# Patient Record
Sex: Female | Born: 1965
Health system: Southern US, Community
[De-identification: ages and names within clinical notes are randomized; demographics above are authoritative.]

## PROBLEM LIST (undated history)

## (undated) DIAGNOSIS — Z9889 Other specified postprocedural states: Secondary | ICD-10-CM

## (undated) DIAGNOSIS — D259 Leiomyoma of uterus, unspecified: Secondary | ICD-10-CM

## (undated) DIAGNOSIS — R112 Nausea with vomiting, unspecified: Secondary | ICD-10-CM

## (undated) DIAGNOSIS — D649 Anemia, unspecified: Secondary | ICD-10-CM

## (undated) DIAGNOSIS — J45909 Unspecified asthma, uncomplicated: Secondary | ICD-10-CM

## (undated) DIAGNOSIS — R011 Cardiac murmur, unspecified: Secondary | ICD-10-CM

## (undated) DIAGNOSIS — R51 Headache: Secondary | ICD-10-CM

## (undated) DIAGNOSIS — O3410 Maternal care for benign tumor of corpus uteri, unspecified trimester: Secondary | ICD-10-CM

## (undated) DIAGNOSIS — F419 Anxiety disorder, unspecified: Secondary | ICD-10-CM

---

## 1998-03-24 ENCOUNTER — Inpatient Hospital Stay (HOSPITAL_COMMUNITY): Admission: AD | Admit: 1998-03-24 | Discharge: 1998-03-24 | Payer: Self-pay | Admitting: Obstetrics and Gynecology

## 1998-04-30 ENCOUNTER — Inpatient Hospital Stay (HOSPITAL_COMMUNITY): Admission: AD | Admit: 1998-04-30 | Discharge: 1998-05-05 | Payer: Self-pay | Admitting: Obstetrics and Gynecology

## 1998-06-13 ENCOUNTER — Other Ambulatory Visit: Admission: RE | Admit: 1998-06-13 | Discharge: 1998-06-13 | Payer: Self-pay | Admitting: Obstetrics and Gynecology

## 2007-12-03 HISTORY — PX: UTERINE FIBROID EMBOLIZATION: SHX825

## 2012-05-22 ENCOUNTER — Encounter (HOSPITAL_COMMUNITY): Payer: Self-pay | Admitting: Pharmacist

## 2012-05-25 ENCOUNTER — Encounter (HOSPITAL_COMMUNITY)
Admission: RE | Admit: 2012-05-25 | Discharge: 2012-05-25 | Disposition: A | Discharge status: Home / Self Care | Payer: BC Managed Care – PPO | Source: Ambulatory Visit | Attending: Obstetrics and Gynecology | Admitting: Obstetrics and Gynecology

## 2012-05-25 ENCOUNTER — Encounter (HOSPITAL_COMMUNITY): Payer: Self-pay

## 2012-05-25 HISTORY — DX: Cardiac murmur, unspecified: R01.1

## 2012-05-25 HISTORY — DX: Anxiety disorder, unspecified: F41.9

## 2012-05-25 HISTORY — DX: Anemia, unspecified: D64.9

## 2012-05-25 HISTORY — DX: Headache: R51

## 2012-05-25 HISTORY — DX: Unspecified asthma, uncomplicated: J45.909

## 2012-05-25 LAB — SURGICAL PCR SCREEN
MRSA, PCR: NEGATIVE
Staphylococcus aureus: NEGATIVE

## 2012-05-25 LAB — CBC
HCT: 39.9 % (ref 36.0–46.0)
Hemoglobin: 13.1 g/dL (ref 12.0–15.0)
MCHC: 32.8 g/dL (ref 30.0–36.0)
RDW: 13.6 % (ref 11.5–15.5)
WBC: 8.5 10*3/uL (ref 4.0–10.5)

## 2012-05-25 NOTE — Patient Instructions (Addendum)
YOUR PROCEDURE IS SCHEDULED ON:06/08/12  ENTER THROUGH THE MAIN ENTRANCE OF [facility] AT:6am  USE DESK PHONE AND DIAL [id number] TO INFORM us OF YOUR ARRIVAL  CALL [phone] IF YOU HAVE ANY QUESTIONS OR PROBLEMS PRIOR TO YOUR ARRIVAL.  REMEMBER: DO NOT EAT OR DRINK AFTER MIDNIGHT :Sunday  SPECIAL INSTRUCTIONS:bring inhaler to hospital  YOU MAY BRUSH YOUR TEETH THE MORNING OF SURGERY   TAKE THESE MEDICINES THE DAY OF SURGERY WITH SIP OF WATER:none   DO NOT WEAR JEWELRY, EYE MAKEUP, LIPSTICK OR DARK FINGERNAIL POLISH DO NOT WEAR LOTIONS  DO NOT SHAVE FOR 48 HOURS PRIOR TO SURGERY  YOU WILL NOT BE ALLOWED TO DRIVE YOURSELF HOME.

## 2012-05-26 NOTE — H&P (Addendum)
   Bonnie Chen presents today for preop evaluation for total abdominal hysterectomy.  She is continuing to have problems with pressure.  The uterus is to the umbilicus and she desires definitive surgical intervention.  She has no further desires to have a child and presents for hysterectomy.  Menstrual cycles are now as long as 10 days.  Her anemia has mostly resolved.  She is on a multivitamin and iron daily, but she does continue to have a lot of pelvic pressure and a 20-week size fibroid uterus.  The last ultrasound does show multiple calcified fibroids, the largest measuring 7 cm in size.  Smaller ones measuring 5.7 and 4.4 cm in size.  There is a right simple ovarian cyst measuring 3.4 cm in size.   O: Heart:  Regular rate and rhythm.  Lungs clear to auscultation bilaterally.  Abdomen is distended to the umbilicus, approximately 8-9 cm wide by palpation.  Pelvic exam:  Normal Bartholin's, Skene's, urethra.  A small amount of vaginal discharge is noted.  Uterus is anteverted, mobile with the fundus just below the umbilicus, approximately 8-9 cm in width.  Past medical history is significant for exercise-induced asthma.  Past surgical history:  She has had three Cesarean sections and uterine embolization.  She has allergies to cats, dogs and almonds.  Currently on no other medicines.   A&P: Large abdominal fibroids, 20-week size, abnormal bleeding, anemia and pelvic pressure.  No further childbearing desires.  Plan total abdominal hysterectomy with preservation of the ovaries.  Discussed the risks and benefits of the procedure at length including but not limited to risk of infection, bleeding, damage to bowel, bladder, ovaries.  We will try to preserve both ovaries if we can.  She would really like to try to get through her Pfannenstiel skin incision if we can try to do this.  Discussed the limitations but we will try to do this.  If not, we may have to extend it to a midline incision.  Discussed the procedure,  risks and benefits, risk of blood transfusion, risks with general anesthesia.  She does give her informed consent and wishes to proceed.  Discussed recovery at length.  Reviewed the previous MRI results, the previous ultrasound reports, previous visits and discussed all of this with the patient. Dineen Kid Rana Snare, MD/rad  This patient has been seen and examined.   All of her questions were answered.  Labs and vital signs reviewed.  Informed consent has been obtained.  The History and Physical is current.  06/08/12 DL

## 2012-06-07 MED ORDER — DEXTROSE 5 % IV SOLN
2.0000 g | INTRAVENOUS | Status: AC
  Administered 2012-06-08: 2 g via INTRAVENOUS
  Filled 2012-06-07: qty 2

## 2012-06-08 ENCOUNTER — Encounter (HOSPITAL_COMMUNITY): Payer: Self-pay | Admitting: Anesthesiology

## 2012-06-08 ENCOUNTER — Inpatient Hospital Stay (HOSPITAL_COMMUNITY)
Admission: AD | Admit: 2012-06-08 | Discharge: 2012-06-10 | DRG: 359 | Disposition: A | Discharge status: Home / Self Care | Payer: BC Managed Care – PPO | Source: Ambulatory Visit | Attending: Obstetrics and Gynecology | Admitting: Obstetrics and Gynecology

## 2012-06-08 ENCOUNTER — Inpatient Hospital Stay (HOSPITAL_COMMUNITY): Payer: BC Managed Care – PPO | Admitting: Anesthesiology

## 2012-06-08 ENCOUNTER — Encounter (HOSPITAL_COMMUNITY): Payer: Self-pay | Admitting: *Deleted

## 2012-06-08 ENCOUNTER — Encounter (HOSPITAL_COMMUNITY)
Admission: AD | Disposition: A | Discharge status: Home / Self Care | Payer: Self-pay | Source: Ambulatory Visit | Attending: Obstetrics and Gynecology

## 2012-06-08 DIAGNOSIS — D252 Subserosal leiomyoma of uterus: Secondary | ICD-10-CM | POA: Diagnosis present

## 2012-06-08 DIAGNOSIS — Z01812 Encounter for preprocedural laboratory examination: Secondary | ICD-10-CM

## 2012-06-08 DIAGNOSIS — N949 Unspecified condition associated with female genital organs and menstrual cycle: Secondary | ICD-10-CM | POA: Diagnosis present

## 2012-06-08 DIAGNOSIS — D251 Intramural leiomyoma of uterus: Principal | ICD-10-CM | POA: Diagnosis present

## 2012-06-08 DIAGNOSIS — N938 Other specified abnormal uterine and vaginal bleeding: Secondary | ICD-10-CM | POA: Diagnosis present

## 2012-06-08 DIAGNOSIS — D649 Anemia, unspecified: Secondary | ICD-10-CM | POA: Diagnosis present

## 2012-06-08 DIAGNOSIS — Z01818 Encounter for other preprocedural examination: Secondary | ICD-10-CM

## 2012-06-08 DIAGNOSIS — Z90710 Acquired absence of both cervix and uterus: Secondary | ICD-10-CM

## 2012-06-08 HISTORY — PX: ABDOMINAL HYSTERECTOMY: SHX81

## 2012-06-08 SURGERY — HYSTERECTOMY, ABDOMINAL
Anesthesia: General | Site: Abdomen | Wound class: Clean Contaminated

## 2012-06-08 MED ORDER — HYDROMORPHONE HCL PF 1 MG/ML IJ SOLN
INTRAMUSCULAR | Status: AC
  Filled 2012-06-08: qty 1

## 2012-06-08 MED ORDER — OXYCODONE-ACETAMINOPHEN 5-325 MG PO TABS
1.0000 | ORAL_TABLET | ORAL | Status: DC | PRN
  Administered 2012-06-09 (×2): 2 via ORAL
  Administered 2012-06-10: 1 via ORAL
  Filled 2012-06-08: qty 2
  Filled 2012-06-08: qty 1
  Filled 2012-06-08: qty 2

## 2012-06-08 MED ORDER — ACETAMINOPHEN 325 MG PO TABS: 325.0000 mg | ORAL_TABLET | ORAL | Status: DC | PRN

## 2012-06-08 MED ORDER — KETOROLAC TROMETHAMINE 30 MG/ML IJ SOLN: 15.0000 mg | Freq: Once | INTRAMUSCULAR | Status: DC | PRN

## 2012-06-08 MED ORDER — MIDAZOLAM HCL 5 MG/5ML IJ SOLN
INTRAMUSCULAR | Status: DC | PRN
  Administered 2012-06-08: 2 mg via INTRAVENOUS

## 2012-06-08 MED ORDER — ROCURONIUM BROMIDE 50 MG/5ML IV SOLN
INTRAVENOUS | Status: AC
  Filled 2012-06-08: qty 1

## 2012-06-08 MED ORDER — NEOSTIGMINE METHYLSULFATE 1 MG/ML IJ SOLN
INTRAMUSCULAR | Status: AC
  Filled 2012-06-08: qty 10

## 2012-06-08 MED ORDER — GLYCOPYRROLATE 0.2 MG/ML IJ SOLN
INTRAMUSCULAR | Status: DC | PRN
  Administered 2012-06-08: .2 mg via INTRAVENOUS
  Administered 2012-06-08: 0.4 mg via INTRAVENOUS

## 2012-06-08 MED ORDER — DIPHENHYDRAMINE HCL 50 MG/ML IJ SOLN: 12.5000 mg | Freq: Four times a day (QID) | INTRAMUSCULAR | Status: DC | PRN

## 2012-06-08 MED ORDER — LACTATED RINGERS IV SOLN
INTRAVENOUS | Status: DC | PRN
  Administered 2012-06-08 (×3): via INTRAVENOUS

## 2012-06-08 MED ORDER — MIDAZOLAM HCL 2 MG/2ML IJ SOLN
INTRAMUSCULAR | Status: AC
  Filled 2012-06-08: qty 2

## 2012-06-08 MED ORDER — NEOSTIGMINE METHYLSULFATE 1 MG/ML IJ SOLN
INTRAMUSCULAR | Status: DC | PRN
  Administered 2012-06-08: 3 mg via INTRAVENOUS

## 2012-06-08 MED ORDER — MORPHINE SULFATE (PF) 1 MG/ML IV SOLN: INTRAVENOUS | Status: DC

## 2012-06-08 MED ORDER — LIDOCAINE HCL (CARDIAC) 20 MG/ML IV SOLN
INTRAVENOUS | Status: AC
  Filled 2012-06-08: qty 5

## 2012-06-08 MED ORDER — FENTANYL CITRATE 0.05 MG/ML IJ SOLN
INTRAMUSCULAR | Status: AC
  Filled 2012-06-08: qty 5

## 2012-06-08 MED ORDER — ONDANSETRON HCL 4 MG/2ML IJ SOLN: 4.0000 mg | Freq: Four times a day (QID) | INTRAMUSCULAR | Status: DC | PRN

## 2012-06-08 MED ORDER — LORAZEPAM 2 MG/ML IJ SOLN: 0.5000 mg | Freq: Four times a day (QID) | INTRAMUSCULAR | Status: DC | PRN

## 2012-06-08 MED ORDER — DIPHENHYDRAMINE HCL 12.5 MG/5ML PO ELIX: 12.5000 mg | ORAL_SOLUTION | Freq: Four times a day (QID) | ORAL | Status: DC | PRN

## 2012-06-08 MED ORDER — NALOXONE HCL 0.4 MG/ML IJ SOLN: 0.4000 mg | INTRAMUSCULAR | Status: DC | PRN

## 2012-06-08 MED ORDER — PROPOFOL 10 MG/ML IV EMUL
INTRAVENOUS | Status: DC | PRN
  Administered 2012-06-08: 200 mg via INTRAVENOUS

## 2012-06-08 MED ORDER — GLYCOPYRROLATE 0.2 MG/ML IJ SOLN
INTRAMUSCULAR | Status: AC
  Filled 2012-06-08: qty 1

## 2012-06-08 MED ORDER — DEXAMETHASONE SODIUM PHOSPHATE 4 MG/ML IJ SOLN
INTRAMUSCULAR | Status: DC | PRN
  Administered 2012-06-08: 10 mg via INTRAVENOUS

## 2012-06-08 MED ORDER — SODIUM CHLORIDE 0.9 % IJ SOLN: 9.0000 mL | INTRAMUSCULAR | Status: DC | PRN

## 2012-06-08 MED ORDER — HYDROMORPHONE HCL PF 1 MG/ML IJ SOLN
INTRAMUSCULAR | Status: DC | PRN
  Administered 2012-06-08 (×2): 1 mg via INTRAVENOUS

## 2012-06-08 MED ORDER — PROMETHAZINE HCL 25 MG/ML IJ SOLN: 6.2500 mg | INTRAMUSCULAR | Status: DC | PRN

## 2012-06-08 MED ORDER — ONDANSETRON HCL 4 MG/2ML IJ SOLN
INTRAMUSCULAR | Status: DC | PRN
  Administered 2012-06-08: 4 mg via INTRAVENOUS

## 2012-06-08 MED ORDER — MORPHINE SULFATE 0.5 MG/ML IJ SOLN
INTRAMUSCULAR | Status: AC
  Filled 2012-06-08: qty 10

## 2012-06-08 MED ORDER — ZOLPIDEM TARTRATE 5 MG PO TABS: 5.0000 mg | ORAL_TABLET | Freq: Every evening | ORAL | Status: DC | PRN

## 2012-06-08 MED ORDER — MIDAZOLAM HCL 2 MG/2ML IJ SOLN: 0.5000 mg | Freq: Once | INTRAMUSCULAR | Status: DC | PRN

## 2012-06-08 MED ORDER — FENTANYL CITRATE 0.05 MG/ML IJ SOLN
INTRAMUSCULAR | Status: AC
  Administered 2012-06-08: 50 ug via INTRAVENOUS
  Filled 2012-06-08: qty 2

## 2012-06-08 MED ORDER — MEPERIDINE HCL 25 MG/ML IJ SOLN: 6.2500 mg | INTRAMUSCULAR | Status: DC | PRN

## 2012-06-08 MED ORDER — FENTANYL CITRATE 0.05 MG/ML IJ SOLN
25.0000 ug | INTRAMUSCULAR | Status: DC | PRN
  Administered 2012-06-08 (×2): 50 ug via INTRAVENOUS

## 2012-06-08 MED ORDER — LIDOCAINE HCL (CARDIAC) 20 MG/ML IV SOLN
INTRAVENOUS | Status: DC | PRN
  Administered 2012-06-08: 100 mg via INTRAVENOUS

## 2012-06-08 MED ORDER — HYDROMORPHONE 0.3 MG/ML IV SOLN
INTRAVENOUS | Status: DC
  Administered 2012-06-08: 0.399 mg via INTRAVENOUS
  Administered 2012-06-08: 3.6 mg via INTRAVENOUS
  Administered 2012-06-08: 0.4 mg via INTRAVENOUS
  Administered 2012-06-08: 11:00:00 via INTRAVENOUS
  Administered 2012-06-09: 0.4 mg via INTRAVENOUS
  Administered 2012-06-09: 0.799 mg via INTRAVENOUS
  Administered 2012-06-09: 0.2 mg via INTRAVENOUS
  Filled 2012-06-08: qty 25

## 2012-06-08 MED ORDER — ROCURONIUM BROMIDE 100 MG/10ML IV SOLN
INTRAVENOUS | Status: DC | PRN
  Administered 2012-06-08: 40 mg via INTRAVENOUS

## 2012-06-08 MED ORDER — FENTANYL CITRATE 0.05 MG/ML IJ SOLN
INTRAMUSCULAR | Status: DC | PRN
  Administered 2012-06-08: 100 ug via INTRAVENOUS
  Administered 2012-06-08: 150 ug via INTRAVENOUS

## 2012-06-08 MED ORDER — IBUPROFEN 600 MG PO TABS
600.0000 mg | ORAL_TABLET | Freq: Four times a day (QID) | ORAL | Status: DC | PRN
  Administered 2012-06-09 – 2012-06-10 (×4): 600 mg via ORAL
  Filled 2012-06-08 (×4): qty 1

## 2012-06-08 MED ORDER — HYDROMORPHONE HCL PF 1 MG/ML IJ SOLN: 0.2000 mg | INTRAMUSCULAR | Status: DC | PRN

## 2012-06-08 MED ORDER — ALBUTEROL SULFATE HFA 108 (90 BASE) MCG/ACT IN AERS
2.0000 | INHALATION_SPRAY | Freq: Four times a day (QID) | RESPIRATORY_TRACT | Status: DC | PRN
  Filled 2012-06-08: qty 6.7

## 2012-06-08 MED ORDER — MENTHOL 3 MG MT LOZG: 1.0000 | LOZENGE | OROMUCOSAL | Status: DC | PRN

## 2012-06-08 MED ORDER — PROPOFOL 10 MG/ML IV EMUL
INTRAVENOUS | Status: AC
  Filled 2012-06-08: qty 20

## 2012-06-08 MED ORDER — DEXTROSE-NACL 5-0.45 % IV SOLN
INTRAVENOUS | Status: DC
  Administered 2012-06-08 – 2012-06-09 (×3): via INTRAVENOUS

## 2012-06-08 MED ORDER — ONDANSETRON HCL 4 MG/2ML IJ SOLN
INTRAMUSCULAR | Status: AC
  Filled 2012-06-08: qty 2

## 2012-06-08 SURGICAL SUPPLY — 26 items
CANISTER SUCTION 2500CC (MISCELLANEOUS) ×2 IMPLANT
CLOTH BEACON ORANGE TIMEOUT ST (SAFETY) ×2 IMPLANT
DRSG COVADERM 4X10 (GAUZE/BANDAGES/DRESSINGS) ×2 IMPLANT
ELECT LIGASURE LONG (ELECTRODE) ×2 IMPLANT
GLOVE BIO SURGEON STRL SZ7.5 (GLOVE) ×2 IMPLANT
GLOVE BIO SURGEON STRL SZ8 (GLOVE) ×2 IMPLANT
GLOVE SURG ORTHO 8.0 STRL STRW (GLOVE) ×2 IMPLANT
GOWN PREVENTION PLUS LG XLONG (DISPOSABLE) ×4 IMPLANT
GOWN STRL REIN XL XLG (GOWN DISPOSABLE) ×2 IMPLANT
NS IRRIG 1000ML POUR BTL (IV SOLUTION) ×4 IMPLANT
PACK ABDOMINAL GYN (CUSTOM PROCEDURE TRAY) ×2 IMPLANT
PAD OB MATERNITY 4.3X12.25 (PERSONAL CARE ITEMS) ×2 IMPLANT
PROTECTOR NERVE ULNAR (MISCELLANEOUS) ×2 IMPLANT
SPONGE LAP 18X18 X RAY DECT (DISPOSABLE) ×4 IMPLANT
STAPLER VISISTAT 35W (STAPLE) ×2 IMPLANT
SUT CHROMIC 3 0 SH 27 (SUTURE) ×2 IMPLANT
SUT MNCRL 0 MO-4 VIOLET 18 CR (SUTURE) ×3 IMPLANT
SUT MNCRL 0 VIOLET 6X18 (SUTURE) ×1 IMPLANT
SUT MONOCRYL 0 6X18 (SUTURE) ×1
SUT MONOCRYL 0 MO 4 18  CR/8 (SUTURE) ×3
SUT PDS AB 0 CT1 27 (SUTURE) ×4 IMPLANT
SUT VIC AB 1 CTX 36 (SUTURE) ×2
SUT VIC AB 1 CTX36XBRD ANBCTRL (SUTURE) ×2 IMPLANT
SUT VIC AB 2-0 CT1 (SUTURE) ×2 IMPLANT
TOWEL OR 17X24 6PK STRL BLUE (TOWEL DISPOSABLE) ×4 IMPLANT
TRAY FOLEY CATH 14FR (SET/KITS/TRAYS/PACK) ×2 IMPLANT

## 2012-06-08 NOTE — Progress Notes (Signed)
Patient ID: Bonnie Chen, female   DOB: 08/13/1966, 47 y.o.   MRN: [id number] Pt without complaints Pain well controlled VSSAF UOP good Bandage dry Abd soft nt  POST OP Doing well  Discussed plan of care and discussed surgical findings DL

## 2012-06-08 NOTE — Transfer of Care (Signed)
Immediate Anesthesia Transfer of Care Note  Patient: Bonnie Chen  Procedure(s) Performed: Procedure(s) (LRB): HYSTERECTOMY ABDOMINAL (N/A)  Patient Location: PACU  Anesthesia Type: General  Level of Consciousness: awake, alert  and oriented  Airway & Oxygen Therapy: Patient Spontanous Breathing and Patient connected to nasal cannula oxygen  Post-op Assessment: Report given to PACU RN and Post -op Vital signs reviewed and stable  Post vital signs: stable  Complications: No apparent anesthesia complications

## 2012-06-08 NOTE — Anesthesia Postprocedure Evaluation (Signed)
  Anesthesia Post-op Note  Patient: Bonnie Chen  Procedure(s) Performed: Procedure(s) (LRB): HYSTERECTOMY ABDOMINAL (N/A) Patient is awake and responsive. Pain and nausea are reasonably well controlled. Vital signs are stable and clinically acceptable. Oxygen saturation is clinically acceptable. There are no apparent anesthetic complications at this time. Patient is ready for discharge.

## 2012-06-08 NOTE — Addendum Note (Signed)
Addendum  created 06/08/12 1756 by Suella Grove, CRNA   Modules edited:Notes Section

## 2012-06-08 NOTE — Op Note (Signed)
NAME:  Bonnie Chen, [name]:  [id number]  MEDICAL RECORD NO.:  [id number]  LOCATION:  9302                          FACILITY:  [facility]  PHYSICIAN:  Dineen Kid. Rana Snare, M.D.    DATE OF BIRTH:  1966-05-21  DATE OF PROCEDURE:  06/08/2012 DATE OF DISCHARGE:                              OPERATIVE REPORT   PREOPERATIVE DIAGNOSIS:  Large abdominal fibroid, 20-week size. Abnormal uterine bleeding, anemia, pelvic pressure.  No further childbearing desires.  POSTOPERATIVE DIAGNOSIS:  Large abdominal fibroid, 20-week size. Abnormal uterine bleeding, anemia, pelvic pressure. No further childbearing desires.  PROCEDURE:  Total abdominal hysterectomy.  SURGEON:  Dineen Kid. Rana Snare, MD  ASSISTANT:  Freddy Finner, MD  ANESTHESIA:  General endotracheal.  INDICATIONS:  [name] is a 46 year old, with no further childbearing desires, having worsening problems with pain, bleeding, fibroids, desires definitive surgical intervention.  Presents for hysterectomy. We discussed the risks and benefits of the procedure at length. Informed consent was obtained.  See history and physical for further details.  FINDINGS:  At the time of surgery, large uterine fibroids and pelvic adhesions, normal-appearing ovaries.  DESCRIPTION OF PROCEDURE:  After adequate analgesia, the patient was placed in the supine position.  She was sterilely prepped and draped. Bladder sterilely drained with Foley catheter.  Pfannenstiel skin incision made 2 fingerbreadths above the pubic symphysis, taken down sharply.  Fascia was incised transversely, extended superiorly and inferiorly off the bellies rectus muscle, separated sharply in midline, peritoneum was entered sharply.  Large uterus was delivered through the incision with a thyroid tenaculum on the fundus of the uterus.  LigaSure instrument was used to ligate the round ligaments bilaterally and the utero-ovarian ligaments bilaterally.  Then the tubes and  ovaries followed lateral.  O'Connor-O'Sullivan retractor was then placed.  Bowel was packed cephalad.  The inferior portion of broad ligament was ligated with the LigaSure instrument down to the level of the uterine vasculature, which was ligated with LigaSure instrument.  The bladder was carefully dissected off the anterior surface of the cervix.  A LigaSure instrument was used to ligate across uterine vasculature down across the cardinal ligaments.  Haney clamp was then placed across the uterosacral ligaments bilaterally.  It was dissected and suture ligated with 0 Monocryl suture.  The vagina was entered sharply and the uterus and cervix were removed intact.  The vagina was identified and ligated with a figure-of-eight 0 Monocryl suture, incorporating the uterosacral ligaments.  The vagina was then closed in a horizontal fashion using figure-of-eights of 0 Monocryl suture with good approximation, good hemostasis achieved.  The uterosacral ligaments were then plicated in the midline.  Irrigation applied after adequate hemostasis was assured. Reexamination revealed good hemostasis of the ovarian pedicles.  The packing was then removed.  O'Connor-O'Sullivan retractor was then removed.  The peritoneum was then closed with 2-0 Vicryl suture.  The fascia was then closed with 2 sutures of 0 PDS in a running fashion. Irrigation was applied and after adequate hemostasis, the skin was stapled, Steri-Strips applied.  The patient tolerated procedure well was stable transfer to recovery room.  Sponge and instrument count was normal x3.  Estimated blood loss was 300 mL.  The patient received 2 g of cefotetan preoperatively.     Dineen Kid Rana Snare, M.D.     DCL/MEDQ  D:  06/08/2012  T:  06/08/2012  Job:  [id number]

## 2012-06-08 NOTE — Progress Notes (Signed)
Ur chart review completed.  

## 2012-06-08 NOTE — Anesthesia Preprocedure Evaluation (Addendum)
Anesthesia Evaluation  Patient identified by MRN, date of birth, ID band Patient awake    Reviewed: Allergy & Precautions, H&P , Patient's Chart, lab work & pertinent test results, reviewed documented beta blocker date and time   History of Anesthesia Complications Negative for: history of anesthetic complications  Airway Mallampati: II TM Distance: >3 FB Neck ROM: full    Dental No notable dental hx.    Pulmonary neg pulmonary ROS, asthma , Current Smoker,   Very mild wheeze bilateral with strong exhalation  + wheezing      Cardiovascular Exercise Tolerance: Good negative cardio ROS  + Valvular Problems/Murmurs Rhythm:regular Rate:Normal     Neuro/Psych  Headaches, PSYCHIATRIC DISORDERS Anxiety negative neurological ROS  negative psych ROS   GI/Hepatic negative GI ROS, Neg liver ROS,   Endo/Other  negative endocrine ROS  Renal/GU negative Renal ROS     Musculoskeletal   Abdominal   Peds  Hematology negative hematology ROS (+)   Anesthesia Other Findings Asthma     Anemia        Heart murmur     Headache        Anxiety   panic disorder    Reproductive/Obstetrics negative OB ROS                          Anesthesia Physical Anesthesia Plan  ASA: II  Anesthesia Plan: General ETT   Post-op Pain Management:    Induction:   Airway Management Planned:   Additional Equipment:   Intra-op Plan:   Post-operative Plan:   Informed Consent: I have reviewed the patients History and Physical, chart, labs and discussed the procedure including the risks, benefits and alternatives for the proposed anesthesia with the patient or authorized representative who has indicated his/her understanding and acceptance.   Dental Advisory Given  Plan Discussed with: CRNA and Surgeon  Anesthesia Plan Comments:         Anesthesia Quick Evaluation

## 2012-06-08 NOTE — Brief Op Note (Addendum)
06/08/2012  9:03 AM  PATIENT:  Bonnie Chen  46 y.o. female  PRE-OPERATIVE DIAGNOSIS:  Large 20 Week Size Fibroids, Abnormal Uterine Bleeding, Anemia, and Pelvic Pain  POST-OPERATIVE DIAGNOSIS:  Large 20 Week Size Fibroids, Abnormal Uterine Bleeding, Anemia, and Pelvic Pain  PROCEDURE:  Procedure(s) (LRB): HYSTERECTOMY ABDOMINAL (N/A)  SURGEON:  Surgeon(s) and Role:    * Turner Daniels, MD - Primary    * W Varney Baas, MD - Assisting  PHYSICIAN ASSISTANT:   ASSISTANTS:    ANESTHESIA:   general  EBL:  Total I/O In: 2000 [I.V.:2000] Out: 600 [Urine:300; Blood:300]  BLOOD ADMINISTERED:none  DRAINS: Urinary Catheter (Foley)   LOCAL MEDICATIONS USED:  NONE  SPECIMEN:  Source of Specimen:  uterus  DISPOSITION OF SPECIMEN:  PATHOLOGY  COUNTS:  YES  TOURNIQUET:  * No tourniquets in log *  DICTATION: .Other Dictation: Dictation Number [id number]  PLAN OF CARE: Admit to inpatient   PATIENT DISPOSITION:  PACU - hemodynamically stable.   Delay start of Pharmacological VTE agent (>24hrs) due to surgical blood loss or risk of bleeding: no

## 2012-06-08 NOTE — Progress Notes (Signed)
Pt's HR consistently 48-51bpm.  No SOB, chest pain or tightness, or dizziness.  Other VS stable.  Phoned Dr. Rana Snare with update.  He stated to reduce PCA to low-dose.  No other new orders received at this time.

## 2012-06-08 NOTE — Anesthesia Postprocedure Evaluation (Signed)
  Anesthesia Post-op Note  Patient: Bonnie Chen  Procedure(s) Performed: Procedure(s) (LRB): HYSTERECTOMY ABDOMINAL (N/A)  Patient Location: [facility]  Anesthesia Type: General  Level of Consciousness: awake, oriented, sedated and patient cooperative  Airway and Oxygen Therapy: Patient Spontanous Breathing and Patient connected to nasal cannula oxygen  Post-op Pain: mild  Post-op Assessment: Patient's Cardiovascular Status Stable and Respiratory Function Stable  Post-op Vital Signs: Reviewed and stable  Complications: No apparent anesthesia complications

## 2012-06-09 ENCOUNTER — Encounter (HOSPITAL_COMMUNITY): Payer: Self-pay | Admitting: Obstetrics and Gynecology

## 2012-06-09 LAB — CBC
Hemoglobin: 11.4 g/dL — ABNORMAL LOW (ref 12.0–15.0)
Platelets: 283 10*3/uL (ref 150–400)
RBC: 4.13 MIL/uL (ref 3.87–5.11)
WBC: 16 10*3/uL — ABNORMAL HIGH (ref 4.0–10.5)

## 2012-06-09 NOTE — Progress Notes (Signed)
1 Day Post-Op Procedure(s) (LRB): HYSTERECTOMY ABDOMINAL (N/A)  Subjective: Patient reports tolerating PO, + flatus and no problems voiding.    Objective: I have reviewed patient's vital signs, intake and output, medications and labs.  General: alert, cooperative and mild distress GI: soft, non-tender; bowel sounds normal; no masses,  no organomegaly  Assessment: s/p Procedure(s) (LRB): HYSTERECTOMY ABDOMINAL (N/A): stable and progressing well  Plan: Advance diet Encourage ambulation Advance to PO medication  LOS: 1 day    Kalee Broxton C 06/09/2012, 9:09 AM

## 2012-06-10 MED ORDER — IBUPROFEN 600 MG PO TABS: 600.0000 mg | ORAL_TABLET | Freq: Four times a day (QID) | ORAL | Status: AC | PRN

## 2012-06-10 MED ORDER — OXYCODONE-ACETAMINOPHEN 5-325 MG PO TABS: 1.0000 | ORAL_TABLET | ORAL | Status: AC | PRN

## 2012-06-10 NOTE — Discharge Summary (Signed)
Physician Discharge Summary  Patient ID: Bonnie Chen MRN: [id number] DOB/AGE: 07-Jun-1966 46 y.o.  Admit date: 06/08/2012 Discharge date: 06/10/2012  Admission Diagnoses:  Discharge Diagnoses:  Active Problems:  * No active hospital problems. *    Discharged Condition: good  Hospital Course: Pt underwent an uncomplicated TAH.  Her postop course was unremarkable with good return of bowel function and ambulation.  She had a stable postop Hgb and by post op day number 2 was tolerating po well, ambulating without difficulty and desired dc home.  Consults: None  Significant Diagnostic Studies:   Treatments: surgery: TAH  Discharge Exam: Blood pressure 108/74, pulse 53, temperature 98.2 F (36.8 C), temperature source Oral, resp. rate 16, height 5\' 3"  (1.6 m), weight 77.111 kg (170 lb), SpO2 97.00%. General appearance: alert, cooperative and no distress GI: soft, non-tender; bowel sounds normal; no masses,  no organomegaly Incision/Wound:CD & I  Disposition:   Discharge Orders    Future Orders Please Complete By Expires   Diet general      Increase activity slowly      Call MD for:  temperature >100.4      Call MD for:  persistant nausea and vomiting      Call MD for:  severe uncontrolled pain      Call MD for:  redness, tenderness, or signs of infection (pain, swelling, redness, odor or green/yellow discharge around incision site)      Call MD for:  difficulty breathing, headache or visual disturbances      Discharge instructions      Comments:   Call office for appt Friday to have staples removed   No dressing needed      Driving Restrictions      Comments:   No driving for 2 weeks   Sexual Activity Restrictions      Comments:   Nothing in vagina for 6 weeks     Medication List  As of 06/10/2012  9:40 AM   TAKE these medications         albuterol 108 (90 BASE) MCG/ACT inhaler   Commonly known as: PROVENTIL HFA;VENTOLIN HFA   Inhale 2 puffs into the lungs every 6  (six) hours as needed. For asthma      ferrous sulfate 325 (65 FE) MG tablet   Take 325 mg by mouth daily with breakfast.      ibuprofen 600 MG tablet   Commonly known as: ADVIL,MOTRIN   Take 1 tablet (600 mg total) by mouth every 6 (six) hours as needed (mild pain).      METROGEL EX   Apply 1 application topically daily as needed. For infection      multivitamin with minerals tablet   Take 1 tablet by mouth daily.      oxyCODONE-acetaminophen 5-325 MG per tablet   Commonly known as: PERCOCET   Take 1-2 tablets by mouth every 3 (three) hours as needed (moderate to severe pain (when tolerating fluids)).             Signed: Damoni Erker C 06/10/2012, 9:40 AM

## 2012-06-10 NOTE — Progress Notes (Signed)
Pt discharged   Teaching complete  Pt will return  To office  For staple removal

## 2012-06-10 NOTE — Progress Notes (Signed)
2 Days Post-Op Procedure(s) (LRB): HYSTERECTOMY ABDOMINAL (N/A)  Subjective: Patient reports tolerating PO, + flatus and no problems voiding.    Objective: I have reviewed patient's vital signs, intake and output, medications and microbiology.  General: alert, cooperative and mild distress GI: soft, non-tender; bowel sounds normal; no masses,  no organomegaly  Assessment: s/p Procedure(s) (LRB): HYSTERECTOMY ABDOMINAL (N/A): stable, progressing well and tolerating diet  Plan: Discharge home  LOS: 2 days    Eleasha Cataldo C 06/10/2012, 9:36 AM

## 2012-06-12 ENCOUNTER — Inpatient Hospital Stay (HOSPITAL_COMMUNITY)
Admission: AD | Admit: 2012-06-12 | Discharge: 2012-06-12 | Disposition: A | Discharge status: Home / Self Care | Payer: BC Managed Care – PPO | Source: Ambulatory Visit | Attending: Obstetrics and Gynecology | Admitting: Obstetrics and Gynecology

## 2012-06-12 ENCOUNTER — Encounter (HOSPITAL_COMMUNITY): Payer: Self-pay | Admitting: *Deleted

## 2012-06-12 DIAGNOSIS — T792XXA Traumatic secondary and recurrent hemorrhage and seroma, initial encounter: Secondary | ICD-10-CM

## 2012-06-12 DIAGNOSIS — IMO0002 Reserved for concepts with insufficient information to code with codable children: Secondary | ICD-10-CM | POA: Insufficient documentation

## 2012-06-12 DIAGNOSIS — T148XXA Other injury of unspecified body region, initial encounter: Secondary | ICD-10-CM

## 2012-06-12 HISTORY — DX: Maternal care for benign tumor of corpus uteri, unspecified trimester: O34.10

## 2012-06-12 HISTORY — DX: Other specified postprocedural states: Z98.890

## 2012-06-12 HISTORY — DX: Other specified postprocedural states: R11.2

## 2012-06-12 HISTORY — DX: Leiomyoma of uterus, unspecified: D25.9

## 2012-06-12 LAB — URINALYSIS, ROUTINE W REFLEX MICROSCOPIC
Glucose, UA: NEGATIVE mg/dL
Leukocytes, UA: NEGATIVE
Nitrite: NEGATIVE
Specific Gravity, Urine: 1.01 (ref 1.005–1.030)
pH: 6 (ref 5.0–8.0)

## 2012-06-12 LAB — URINE MICROSCOPIC-ADD ON

## 2012-06-12 NOTE — MAU Note (Signed)
Pt also c/o urinary frequency and urgency since foley cath was removed after surgery.

## 2012-06-12 NOTE — MAU Note (Signed)
Pt states had total hysterectomy 06/08/2012, large fibroid removed as well. Pt has lower abdominal incision that is closed with staples at present. Has had issues with bleeding post-op while still in hospital. Pt had large gush of blood this afternoon while awaiting MD advice.

## 2012-06-12 NOTE — MAU Provider Note (Signed)
History     CSN: [id number]  Arrival date and time: 06/12/12 1313   First Provider Initiated Contact with Patient 06/12/12 1420      Chief Complaint  Patient presents with  . Post-op incision splitting    HPI  46 yo [id number] female who presents with complaints of post-operative incision drainage s/p abdominal hysterectomy 06/08/12. Pt states would has been draining bloody fluid X2 days following coughing spell.  Pt believes she was supposed to have staples taken out today at office called after office hours. Pt has been using gauze pads to absorb drainage; pads have not been saturated.  OB History    Grav Para Term Preterm Abortions TAB SAB Ect Mult Living   7 3 3  4 3 1   3       Past Medical History  Diagnosis Date  . Asthma   . Anemia   . Heart murmur   . Headache   . Anxiety     panic disorder  . Uterine fibroids affecting pregnancy   . PONV (postoperative nausea and vomiting)     Past Surgical History  Procedure Date  . Cesarean section     x 3  . Uterine fibroid embolization 2009  . Abdominal hysterectomy 06/08/2012    Procedure: HYSTERECTOMY ABDOMINAL;  Surgeon: Turner Daniels, MD;  Location: [facility];  Service: Gynecology;  Laterality: N/A;    Family History  Problem Relation Age of Onset  . Other Neg Hx     History  Substance Use Topics  . Smoking status: Former Smoker -- 0.5 packs/day    Types: Cigarettes    Quit date: 06/08/2012  . Smokeless tobacco: Not on file  . Alcohol Use: No    Allergies: No Known Allergies  Prescriptions prior to admission  Medication Sig Dispense Refill  . albuterol (PROVENTIL HFA;VENTOLIN HFA) 108 (90 BASE) MCG/ACT inhaler Inhale 2 puffs into the lungs every 6 (six) hours as needed. For asthma      . ferrous sulfate 325 (65 FE) MG tablet Take 325 mg by mouth daily with breakfast.      . ibuprofen (ADVIL,MOTRIN) 600 MG tablet Take 1 tablet (600 mg total) by mouth every 6 (six) hours as needed (mild pain).  30 tablet  0  .  MetroNIDAZOLE (METROGEL EX) Apply 1 application topically daily as needed. For infection      . Multiple Vitamins-Minerals (MULTIVITAMIN WITH MINERALS) tablet Take 1 tablet by mouth daily.      Marland Kitchen oxyCODONE-acetaminophen (PERCOCET) 5-325 MG per tablet Take 1-2 tablets by mouth every 3 (three) hours as needed (moderate to severe pain (when tolerating fluids)).  30 tablet  0    Review of Systems  Constitutional: Negative for fever and chills.  Eyes: Blurred vision: states she swallow saliva and air when talking that makes her cough.  Respiratory: Negative.   Cardiovascular: Negative.   Gastrointestinal: Negative for abdominal pain (incisional drainage).   Physical Exam   Blood pressure 154/73, pulse 53, temperature 98.3 F (36.8 C), temperature source Oral, resp. rate 16, height 5' 2.75" (1.594 m), weight 78.019 kg (172 lb), last menstrual period 05/11/2012, SpO2 100.00%.  Physical Exam  Constitutional: She appears well-developed and well-nourished. No distress.  GI: Soft. There is Tenderness: minimal at incision line..  Skin:       Abdominal incision without surrounding erythema or purulent drainage; skin is well-approximated, staples in place. Drainage on gauze minimal; serosanguinous fluid.    MAU Course  Procedures  MDM  14:30  Consulted with Dr. Renaldo Fiddler, who is on call, and reported MSE.  Plan to have patient call office on Monday am and be seen for re-evaluation  Assessment and Plan  A: Post-operative #4  incision, healing appropriately     P: Call MD on call over the weekend  if severe pain, purulent drainage, or disruption of wound edges or fever      Call office on Monday to have re-evaluation that day

## 2012-06-15 ENCOUNTER — Inpatient Hospital Stay (HOSPITAL_COMMUNITY): Payer: BC Managed Care – PPO | Admitting: Anesthesiology

## 2012-06-15 ENCOUNTER — Inpatient Hospital Stay (HOSPITAL_COMMUNITY)
Admission: AD | Admit: 2012-06-15 | Discharge: 2012-06-18 | DRG: 443 | Disposition: A | Discharge status: Home / Self Care | Payer: BC Managed Care – PPO | Source: Ambulatory Visit | Attending: Obstetrics and Gynecology | Admitting: Obstetrics and Gynecology

## 2012-06-15 ENCOUNTER — Encounter (HOSPITAL_COMMUNITY): Payer: Self-pay | Admitting: *Deleted

## 2012-06-15 ENCOUNTER — Encounter (HOSPITAL_COMMUNITY): Payer: Self-pay | Admitting: Anesthesiology

## 2012-06-15 ENCOUNTER — Encounter (HOSPITAL_COMMUNITY)
Admission: AD | Disposition: A | Discharge status: Home / Self Care | Payer: Self-pay | Source: Ambulatory Visit | Attending: Obstetrics and Gynecology

## 2012-06-15 DIAGNOSIS — T8131XA Disruption of external operation (surgical) wound, not elsewhere classified, initial encounter: Secondary | ICD-10-CM

## 2012-06-15 DIAGNOSIS — T8130XA Disruption of wound, unspecified, initial encounter: Principal | ICD-10-CM | POA: Diagnosis present

## 2012-06-15 HISTORY — PX: ABDOMINAL WOUND DEHISCENCE: SHX540

## 2012-06-15 LAB — CBC WITH DIFFERENTIAL/PLATELET
Basophils Absolute: 0.1 10*3/uL (ref 0.0–0.1)
Basophils Relative: 1 % (ref 0–1)
Eosinophils Relative: 5 % (ref 0–5)
HCT: 35.1 % — ABNORMAL LOW (ref 36.0–46.0)
Hemoglobin: 11.5 g/dL — ABNORMAL LOW (ref 12.0–15.0)
MCHC: 32.8 g/dL (ref 30.0–36.0)
MCV: 84.6 fL (ref 78.0–100.0)
Monocytes Absolute: 0.6 10*3/uL (ref 0.1–1.0)
Monocytes Relative: 6 % (ref 3–12)
Neutro Abs: 6.7 10*3/uL (ref 1.7–7.7)
RDW: 13.6 % (ref 11.5–15.5)

## 2012-06-15 SURGERY — REPAIR, DEHISCENCE, WOUND, ABDOMEN
Anesthesia: Spinal | Wound class: Clean Contaminated

## 2012-06-15 MED ORDER — ZOLPIDEM TARTRATE 5 MG PO TABS: 5.0000 mg | ORAL_TABLET | Freq: Every evening | ORAL | Status: DC | PRN

## 2012-06-15 MED ORDER — OXYCODONE-ACETAMINOPHEN 5-325 MG PO TABS
1.0000 | ORAL_TABLET | ORAL | Status: DC | PRN
  Administered 2012-06-16: 2 via ORAL
  Administered 2012-06-16: 1 via ORAL
  Administered 2012-06-16: 2 via ORAL
  Administered 2012-06-16 – 2012-06-17 (×3): 1 via ORAL
  Administered 2012-06-17: 2 via ORAL
  Administered 2012-06-18: 1 via ORAL
  Filled 2012-06-15: qty 2
  Filled 2012-06-15: qty 1
  Filled 2012-06-15 (×2): qty 2
  Filled 2012-06-15 (×2): qty 1
  Filled 2012-06-15: qty 2

## 2012-06-15 MED ORDER — LACTATED RINGERS IV SOLN
INTRAVENOUS | Status: DC
  Administered 2012-06-15 (×3): via INTRAVENOUS

## 2012-06-15 MED ORDER — ONDANSETRON HCL 4 MG/2ML IJ SOLN
INTRAMUSCULAR | Status: DC | PRN
  Administered 2012-06-15: 4 mg via INTRAVENOUS

## 2012-06-15 MED ORDER — PROPOFOL 10 MG/ML IV EMUL
INTRAVENOUS | Status: DC | PRN
  Administered 2012-06-15: 120 ug/kg/min via INTRAVENOUS

## 2012-06-15 MED ORDER — EPHEDRINE SULFATE 50 MG/ML IJ SOLN
INTRAMUSCULAR | Status: DC | PRN
  Administered 2012-06-15 (×3): 5 mg via INTRAVENOUS

## 2012-06-15 MED ORDER — DEXTROSE 5 % IV SOLN
2.0000 g | Freq: Two times a day (BID) | INTRAVENOUS | Status: AC
  Administered 2012-06-16 – 2012-06-17 (×4): 2 g via INTRAVENOUS
  Filled 2012-06-15 (×4): qty 2

## 2012-06-15 MED ORDER — EPHEDRINE 5 MG/ML INJ
INTRAVENOUS | Status: AC
  Filled 2012-06-15: qty 10

## 2012-06-15 MED ORDER — ONDANSETRON HCL 4 MG/2ML IJ SOLN
INTRAMUSCULAR | Status: AC
  Filled 2012-06-15: qty 2

## 2012-06-15 MED ORDER — FENTANYL CITRATE 0.05 MG/ML IJ SOLN
25.0000 ug | INTRAMUSCULAR | Status: DC | PRN
  Administered 2012-06-15 (×5): 50 ug via INTRAVENOUS

## 2012-06-15 MED ORDER — DIPHENHYDRAMINE HCL 12.5 MG/5ML PO ELIX
12.5000 mg | ORAL_SOLUTION | Freq: Four times a day (QID) | ORAL | Status: DC | PRN
  Filled 2012-06-15: qty 5

## 2012-06-15 MED ORDER — MENTHOL 3 MG MT LOZG: 1.0000 | LOZENGE | OROMUCOSAL | Status: DC | PRN

## 2012-06-15 MED ORDER — FENTANYL CITRATE 0.05 MG/ML IJ SOLN
INTRAMUSCULAR | Status: DC | PRN
  Administered 2012-06-15 (×3): 50 ug via INTRAVENOUS

## 2012-06-15 MED ORDER — IBUPROFEN 600 MG PO TABS
600.0000 mg | ORAL_TABLET | Freq: Four times a day (QID) | ORAL | Status: DC | PRN
  Administered 2012-06-16 – 2012-06-18 (×6): 600 mg via ORAL
  Filled 2012-06-15 (×6): qty 1

## 2012-06-15 MED ORDER — FENTANYL CITRATE 0.05 MG/ML IJ SOLN
INTRAMUSCULAR | Status: AC
  Filled 2012-06-15: qty 5

## 2012-06-15 MED ORDER — ALBUTEROL SULFATE HFA 108 (90 BASE) MCG/ACT IN AERS: 2.0000 | INHALATION_SPRAY | Freq: Four times a day (QID) | RESPIRATORY_TRACT | Status: DC | PRN

## 2012-06-15 MED ORDER — LIDOCAINE HCL (CARDIAC) 20 MG/ML IV SOLN
INTRAVENOUS | Status: DC | PRN
  Administered 2012-06-15: 30 mg via INTRAVENOUS

## 2012-06-15 MED ORDER — FENTANYL CITRATE 0.05 MG/ML IJ SOLN
INTRAMUSCULAR | Status: AC
  Administered 2012-06-15: 50 ug via INTRAVENOUS
  Filled 2012-06-15: qty 2

## 2012-06-15 MED ORDER — DEXTROSE 5 % IV SOLN
2.0000 g | Freq: Once | INTRAVENOUS | Status: AC
  Administered 2012-06-15: 2 g via INTRAVENOUS
  Filled 2012-06-15: qty 2

## 2012-06-15 MED ORDER — SODIUM CHLORIDE 0.9 % IJ SOLN: 9.0000 mL | INTRAMUSCULAR | Status: DC | PRN

## 2012-06-15 MED ORDER — MIDAZOLAM HCL 5 MG/ML IJ SOLN
INTRAMUSCULAR | Status: DC | PRN
  Administered 2012-06-15: 2 mg via INTRAVENOUS

## 2012-06-15 MED ORDER — DEXAMETHASONE SODIUM PHOSPHATE 10 MG/ML IJ SOLN
INTRAMUSCULAR | Status: AC
  Filled 2012-06-15: qty 1

## 2012-06-15 MED ORDER — DEXAMETHASONE SODIUM PHOSPHATE 4 MG/ML IJ SOLN
INTRAMUSCULAR | Status: DC | PRN
  Administered 2012-06-15: 10 mg via INTRAVENOUS

## 2012-06-15 MED ORDER — HYDROMORPHONE HCL PF 1 MG/ML IJ SOLN
0.2000 mg | INTRAMUSCULAR | Status: DC | PRN
  Administered 2012-06-15: 0.5 mg via INTRAVENOUS

## 2012-06-15 MED ORDER — HYDROMORPHONE 0.3 MG/ML IV SOLN
INTRAVENOUS | Status: DC
  Administered 2012-06-15: 22:00:00 via INTRAVENOUS
  Administered 2012-06-16: 0.999 mg via INTRAVENOUS
  Administered 2012-06-16: 0.599 mg via INTRAVENOUS
  Filled 2012-06-15: qty 25

## 2012-06-15 MED ORDER — DIPHENHYDRAMINE HCL 50 MG/ML IJ SOLN: 12.5000 mg | Freq: Four times a day (QID) | INTRAMUSCULAR | Status: DC | PRN

## 2012-06-15 MED ORDER — FENTANYL CITRATE 0.05 MG/ML IJ SOLN: 100.0000 ug | INTRAMUSCULAR | Status: DC | PRN

## 2012-06-15 MED ORDER — LIDOCAINE HCL (CARDIAC) 20 MG/ML IV SOLN
INTRAVENOUS | Status: AC
  Filled 2012-06-15: qty 5

## 2012-06-15 MED ORDER — PROPOFOL 10 MG/ML IV EMUL
INTRAVENOUS | Status: AC
  Filled 2012-06-15: qty 20

## 2012-06-15 MED ORDER — ROCURONIUM BROMIDE 50 MG/5ML IV SOLN
INTRAVENOUS | Status: AC
  Filled 2012-06-15: qty 1

## 2012-06-15 MED ORDER — HYDROMORPHONE HCL PF 1 MG/ML IJ SOLN
INTRAMUSCULAR | Status: AC
  Filled 2012-06-15: qty 1

## 2012-06-15 MED ORDER — DEXTROSE-NACL 5-0.45 % IV SOLN
INTRAVENOUS | Status: DC
  Administered 2012-06-15 – 2012-06-16 (×3): via INTRAVENOUS

## 2012-06-15 MED ORDER — ONDANSETRON HCL 4 MG/2ML IJ SOLN: 4.0000 mg | Freq: Four times a day (QID) | INTRAMUSCULAR | Status: DC | PRN

## 2012-06-15 MED ORDER — NALOXONE HCL 0.4 MG/ML IJ SOLN: 0.4000 mg | INTRAMUSCULAR | Status: DC | PRN

## 2012-06-15 MED ORDER — MIDAZOLAM HCL 2 MG/2ML IJ SOLN
INTRAMUSCULAR | Status: AC
  Filled 2012-06-15: qty 2

## 2012-06-15 MED ORDER — HYDROMORPHONE HCL PF 1 MG/ML IJ SOLN
INTRAMUSCULAR | Status: AC
  Administered 2012-06-15: 0.5 mg via INTRAVENOUS
  Filled 2012-06-15: qty 1

## 2012-06-15 SURGICAL SUPPLY — 11 items
CATH FOLEY 2WAY SLVR  5CC 14FR (CATHETERS) ×1
CATH FOLEY 2WAY SLVR 5CC 14FR (CATHETERS) ×1 IMPLANT
CUTTER LINEAR ENDO 35 ETS TH (STAPLE) ×2 IMPLANT
GLOVE BIO SURGEON STRL SZ8 (GLOVE) ×6 IMPLANT
GOWN SURGICAL LARGE (GOWNS) ×6 IMPLANT
PACK ABDOMINAL GYN (CUSTOM PROCEDURE TRAY) ×2 IMPLANT
SLEEVE SCD COMPRESS KNEE MED (MISCELLANEOUS) ×2 IMPLANT
SUT PDS AB 0 CTX 60 (SUTURE) ×4 IMPLANT
SUT PLAIN 2 0 XLH (SUTURE) ×2 IMPLANT
TOWEL NATURAL 4PK STERILE (DISPOSABLE) ×4 IMPLANT
TRAY FOLEY CATH 14FR (SET/KITS/TRAYS/PACK) ×2 IMPLANT

## 2012-06-15 NOTE — H&P (Addendum)
  46 yo SP TAH for large fibroids presented to office with 3-5 days of serosanguinos drainage from incision.  No fever, chills, N or V In office, staples removed and probed small 1-58mm defect with moderate Serosanguinous fluid retrieved for opened incision to 1-2 cm and saw small bowel present c/w fascial dehiscence. Sent directly to [facility] for Abx and surgical repair of above.  Incision Clean and dry exept mid portion 2cm no open with SB seen.  No erythema, warmth or purulence  CBC nl WBC and stable Hgb  Past medical history is significant for exercise-induced asthma. Past surgical history: She has had three Cesarean sections and uterine embolization. She has allergies to cats, dogs and almonds. Currently on no other medicines   Imp/Plan  Fascial Dehsicence 1 week post op Plan surgical repair.  Discussed the findings and procedure with the patient.  There is a high chance of recurrence or possible need for mesh if the tissue is attenuated.  She will  Also need hospitilization for abx and monitoring. Risks and benefits of repair were discussed.  All questions were answered and informed consent was obtained.  Plan to proceed with laparotomy with incisional repair.

## 2012-06-15 NOTE — Anesthesia Postprocedure Evaluation (Signed)
Anesthesia Post Note  Patient: Bonnie Chen  Procedure(s) Performed: Procedure(s) (LRB): ABDOMINAL WOUND DEHISCENCE (N/A)  Anesthesia type: Spinal  Patient location: PACU  Post pain: Pain level controlled  Post assessment: Post-op Vital signs reviewed  Last Vitals:  Filed Vitals:   06/15/12 2000  BP: 150/76  Pulse: 55  Temp: 36.6 C  Resp: 15    Post vital signs: Reviewed  Level of consciousness: awake  Complications: No apparent anesthesia complications

## 2012-06-15 NOTE — Brief Op Note (Signed)
06/15/2012  6:53 PM  PATIENT:  Freada Bergeron  46 y.o. female  PRE-OPERATIVE DIAGNOSIS:  Incision Dehiscence  POST-OPERATIVE DIAGNOSIS:  Incision Dehiscence  PROCEDURE:  Procedure(s) (LRB): ABDOMINAL WOUND DEHISCENCE (N/A)  SURGEON:  Surgeon(s) and Role:    * Turner Daniels, MD - Primary  PHYSICIAN ASSISTANT:   ASSISTANTS: none   ANESTHESIA:   spinal  EBL:  Total I/O In: 2300 [I.V.:2300] Out: 350 [Urine:300; Blood:50]  BLOOD ADMINISTERED:none  DRAINS: Urinary Catheter (Foley)   LOCAL MEDICATIONS USED:  NONE  SPECIMEN:  No Specimen  DISPOSITION OF SPECIMEN:  N/A  COUNTS:  YES  TOURNIQUET:    DICTATION: .Other Dictation: Dictation Number  [id number]  PLAN OF CARE: Admit to inpatient   PATIENT DISPOSITION:  PACU - hemodynamically stable.   Delay start of Pharmacological VTE agent (>24hrs) due to surgical blood loss or risk of bleeding: not applicable

## 2012-06-15 NOTE — Transfer of Care (Signed)
Immediate Anesthesia Transfer of Care Note  Patient: Bonnie Chen  Procedure(s) Performed: Procedure(s) (LRB): ABDOMINAL WOUND DEHISCENCE (N/A)  Patient Location: PACU  Anesthesia Type: Spinal  Level of Consciousness: awake, alert  and oriented  Airway & Oxygen Therapy: Patient Spontanous Breathing  Post-op Assessment: Report given to PACU RN  Post vital signs: Reviewed and stable  Complications: No apparent anesthesia complications

## 2012-06-15 NOTE — MAU Note (Signed)
Sent from MD office, post op visit. Sent over for repair of dehiscence, post hysterectomy.  OR had called- scheduled between 1900-1930 per anes. Pt to rm via wc from desk.  Assisted to bathroom.

## 2012-06-15 NOTE — Anesthesia Procedure Notes (Signed)

## 2012-06-15 NOTE — MAU Note (Signed)
Pt with abdominal hysterectomy with fibroid removal on 06/08/12 and discharged on  06/11/12.  Pt returned to Dr Vance Gather office today with oozing blood and liquid and dehiscence. Pt with history of asthma and coughing spells.  Pt has hernia present as well and fascial dehisence

## 2012-06-15 NOTE — Anesthesia Preprocedure Evaluation (Addendum)
Anesthesia Evaluation  Patient identified by MRN, date of birth, ID band Patient awake    Reviewed: Allergy & Precautions, H&P , Patient's Chart, lab work & pertinent test results, reviewed documented beta blocker date and time   History of Anesthesia Complications Negative for: history of anesthetic complications  Airway Mallampati: II TM Distance: >3 FB Neck ROM: full    Dental No notable dental hx.    Pulmonary neg pulmonary ROS, asthma , Current Smoker,   Very mild wheeze bilateral with strong exhalation  + wheezing      Cardiovascular Exercise Tolerance: Good negative cardio ROS  + Valvular Problems/Murmurs Rhythm:regular Rate:Normal     Neuro/Psych  Headaches, PSYCHIATRIC DISORDERS Anxiety negative neurological ROS  negative psych ROS   GI/Hepatic negative GI ROS, Neg liver ROS,   Endo/Other  negative endocrine ROS  Renal/GU negative Renal ROS     Musculoskeletal   Abdominal   Peds  Hematology negative hematology ROS (+)   Anesthesia Other Findings Asthma     Anemia        Heart murmur     Headache        Anxiety   panic disorder    Reproductive/Obstetrics negative OB ROS                           Anesthesia Physical  Anesthesia Plan  ASA: II  Anesthesia Plan: Spinal   Post-op Pain Management:    Induction:   Airway Management Planned:   Additional Equipment:   Intra-op Plan:   Post-operative Plan:   Informed Consent: I have reviewed the patients History and Physical, chart, labs and discussed the procedure including the risks, benefits and alternatives for the proposed anesthesia with the patient or authorized representative who has indicated his/her understanding and acceptance.   Dental Advisory Given  Plan Discussed with: CRNA and Surgeon  Anesthesia Plan Comments: ( Patient identified.  Risk benefits discussed including failed block,  headache, nerve damage,  paralysis, blood pressure changes, nausea, vomiting, reactions to medication both toxic or allergic, and  back pain.  Patient expressed understanding and wished to proceed )      Anesthesia Quick Evaluation                                   Anesthesia Evaluation  Patient identified by MRN, date of birth, ID band Patient awake    Reviewed: Allergy & Precautions, H&P , Patient's Chart, lab work & pertinent test results, reviewed documented beta blocker date and time   History of Anesthesia Complications Negative for: history of anesthetic complications  Airway Mallampati: II TM Distance: >3 FB Neck ROM: full    Dental No notable dental hx.    Pulmonary neg pulmonary ROS, asthma , Current Smoker,   Very mild wheeze bilateral with strong exhalation  + wheezing      Cardiovascular Exercise Tolerance: Good negative cardio ROS  + Valvular Problems/Murmurs Rhythm:regular Rate:Normal     Neuro/Psych  Headaches, PSYCHIATRIC DISORDERS Anxiety negative neurological ROS  negative psych ROS   GI/Hepatic negative GI ROS, Neg liver ROS,   Endo/Other  negative endocrine ROS  Renal/GU negative Renal ROS     Musculoskeletal   Abdominal   Peds  Hematology negative hematology ROS (+)   Anesthesia Other Findings Asthma     Anemia        Heart murmur  Headache        Anxiety   panic disorder    Reproductive/Obstetrics negative OB ROS                          Anesthesia Physical Anesthesia Plan  ASA: II  Anesthesia Plan: General ETT   Post-op Pain Management:    Induction:   Airway Management Planned:   Additional Equipment:   Intra-op Plan:   Post-operative Plan:   Informed Consent: I have reviewed the patients History and Physical, chart, labs and discussed the procedure including the risks, benefits and alternatives for the proposed anesthesia with the patient or authorized representative who has indicated his/her  understanding and acceptance.   Dental Advisory Given  Plan Discussed with: CRNA and Surgeon  Anesthesia Plan Comments:         Anesthesia Quick Evaluation

## 2012-06-16 ENCOUNTER — Encounter (HOSPITAL_COMMUNITY): Payer: Self-pay | Admitting: Obstetrics and Gynecology

## 2012-06-16 LAB — CBC
HCT: 32.9 % — ABNORMAL LOW (ref 36.0–46.0)
MCH: 28.2 pg (ref 26.0–34.0)
MCHC: 33.1 g/dL (ref 30.0–36.0)
MCV: 85.2 fL (ref 78.0–100.0)
RDW: 13.7 % (ref 11.5–15.5)

## 2012-06-16 NOTE — Progress Notes (Signed)
UR Chart review completed.  

## 2012-06-16 NOTE — Progress Notes (Signed)
1 Day Post-Op Procedure(s) (LRB): ABDOMINAL WOUND DEHISCENCE (N/A)  Subjective: Patient reports incisional pain, tolerating PO and + flatus.    Objective: I have reviewed patient's vital signs, intake and output and labs.  General: alert, cooperative and mild distress Abd soft, bandage dry, BS+ Assessment: s/p Procedure(s) (LRB): ABDOMINAL WOUND DEHISCENCE (N/A): stable  Plan: Advance diet Encourage ambulation Advance to PO medication Discontinue IV fluids  LOS: 1 day    Bonnie Chen 06/16/2012, 8:43 AM

## 2012-06-16 NOTE — Anesthesia Postprocedure Evaluation (Signed)
  Anesthesia Post-op Note  Patient: Bonnie Chen  Procedure(s) Performed: Procedure(s) (LRB): ABDOMINAL WOUND DEHISCENCE (N/A)  Patient Location: [facility]  Anesthesia Type: Spinal  Level of Consciousness: awake  Airway and Oxygen Therapy: Patient Spontanous Breathing  Post-op Pain: mild  Post-op Assessment: Post-op Vital signs reviewed  Post-op Vital Signs: Reviewed and stable  Complications: No apparent anesthesia complications

## 2012-06-16 NOTE — Addendum Note (Signed)
Addendum  created 06/16/12 0815 by Algis Greenhouse, CRNA   Modules edited:Notes Section

## 2012-06-16 NOTE — Op Note (Signed)
NAMEMarland Kitchen  NYIAH, PIANKA NO.:  [id number]  MEDICAL RECORD NO.:  [id number]  LOCATION:  9316                          FACILITY:  [facility]  PHYSICIAN:  Dineen Kid. Rana Snare, M.D.    DATE OF BIRTH:  [birth year]  DATE OF PROCEDURE:  06/15/2012 DATE OF DISCHARGE:                              OPERATIVE REPORT   PREOPERATIVE DIAGNOSIS:  Incisional dehiscence of the fascia.  POSTOP DIAGNOSIS:  Incisional dehiscence of the fascia.  PROCEDURE:  Laparotomy with incisional fascial dehiscence repair.  SURGEON:  Dineen Kid. Rana Snare, MD  ANESTHESIA:  Spinal.  INDICATION:  Ms. Mendel is a 46 year old status post total abdominal hysterectomy 1 week ago that had presented 3-4 days ago to the emergency room with small amount of bleeding from the incision.  Was briefly evaluated and sent home with staples intact to follow up today with me in the office.  She reports she has had some off and on serosanguineous fluid from the mid portion of the incision, but denies any fever, chills, nausea, vomiting, diarrhea, constipation.  She was otherwise normal.  Upon removing the staples, the incision was intact with no evidence of erythema or purulence; however, there were 2 small punctate lesions near the mid portion of the incision that when pressed moderate serosanguineous fluid would present through.  Small sterile Q-tip was then placed through this with the patient experiencing moderate amount of discomfort and no obvious fascia palpated.  Because of this, I did separate the incision to approximately 2 cm in the midline at which time I could see evidence of small bowel protruding through the mid portion of the fascia, which was covered by peritoneum.  Because of the fascial dehiscence, we decided to proceed with surgical treatment.  The patient was sent to [facility] for IV fluids, IV antibiotics, and prepped for surgical repair of this.  Risks and benefits of procedure were discussed at length,  which include, but not limited to risk of infection, bleeding, damage to the bowel, bladder, and the possibility that this could require further surgery or no surgery in the future, the possibility of recurrence of the dehiscence or prolonged antibiotics. She does give her informed consent and wish to proceed.  DESCRIPTION OF PROCEDURE:  After adequate analgesia, the patient was placed in a supine position.  She was sterilely prepped and draped. Bladder sterilely drained with Foley catheter.  The incision was separated easily with the operator's fingertips with the midportion of the incision approximately 3-4 cm of the fascia separated.  The PDS suture was still intact consistent with it having torn through the fascia in the midline.  The PDS suture was easily removed.  The edges of the fascia were sharply debrided with Bovie cautery and scissors and the Camper's and Scarpa's fascia was undermined with Bovie scissors allowing careful visualization of the fascia all the way around.  Examination of the small bowel did show it was covered by peritoneum in the midline. She did have very attenuated rectus muscles, which were not giving much support of the abdominal contents.  Attempts to plicate the rectus muscles was unsuccessful due to the fact that they were so attenuated, they would not stretch  to the midline.  After careful visual inspection of the wound after sharply debriding, there was no evidence of any type of purulence or infection.  No hematoma noted.  The edges were clean and appeared to have good support.  They were grasped with Kocher.  Various areas throughout with tugging, but operator showed good support and no evidence of tearing.  After copious amount of irrigation, adequate hemostasis was assured.  The incision was then closed in a running fashion with a 2-0 PDS double stranded with care taken to get good suture bites along the way and with good support noted.  It  was carefully tied.  Reexamination revealed good support across the entirety of the incision.  The Camper's and Scarpa's fascia were undermined under the skin and the previous scar tissue was debrided.  After copious amount of irrigation, adequate hemostasis was assured.  2-0 plain suture was used to reapproximate the Camper's and Scarpa's fascia.  Staple was then used to close the skin with good approximation and good hemostasis noted.  The patient was then transferred to the recovery room in stable condition.  Sponge and instrument counts were normal x3.  ESTIMATED BLOOD LOSS:  50 mL.  The patient did receive 2 g of cefotetan preoperatively and we will continue this for 24 hours.     Dineen Kid Rana Snare, M.D.     DCL/MEDQ  D:  06/15/2012  T:  06/16/2012  Job:  [id number]

## 2012-06-17 LAB — CBC
MCH: 28.2 pg (ref 26.0–34.0)
MCHC: 32.5 g/dL (ref 30.0–36.0)
Platelets: 242 10*3/uL (ref 150–400)
RBC: 3.37 MIL/uL — ABNORMAL LOW (ref 3.87–5.11)
RDW: 14.1 % (ref 11.5–15.5)

## 2012-06-17 NOTE — Progress Notes (Signed)
2 Days Post-Op Procedure(s) (LRB): ABDOMINAL WOUND DEHISCENCE (N/A)  Subjective: Patient reports tolerating PO, + flatus, + BM and no problems voiding.    Objective: I have reviewed patient's vital signs, intake and output, medications and labs.  General: alert, cooperative and no distress Adb:  Soft nt, BS+.. Inc CD&I  Assessment: s/p Procedure(s) (LRB): ABDOMINAL WOUND DEHISCENCE (N/A): stable and progressing well  Plan: Advance diet Encourage ambulation Advance to PO medication Will cont Abx tonight and dc home tomorrow.  LOS: 2 days    Bonnie Chen C 06/17/2012, 8:31 PM

## 2012-06-18 MED ORDER — HYDROMORPHONE HCL 4 MG PO TABS: 4.0000 mg | ORAL_TABLET | ORAL | Status: AC | PRN

## 2012-06-18 MED ORDER — OXYCODONE-ACETAMINOPHEN 5-325 MG PO TABS: 1.0000 | ORAL_TABLET | ORAL | Status: AC | PRN

## 2012-06-18 MED ORDER — IBUPROFEN 600 MG PO TABS: 600.0000 mg | ORAL_TABLET | Freq: Four times a day (QID) | ORAL | Status: AC | PRN

## 2012-06-18 MED ORDER — OXYCODONE HCL 10 MG PO TABS: 10.0000 mg | ORAL_TABLET | ORAL | Status: AC | PRN

## 2012-06-18 NOTE — Progress Notes (Signed)
Pt  Out in wheelchair   Teaching complete  

## 2012-06-18 NOTE — Discharge Summary (Signed)
Physician Discharge Summary  Patient ID: Bonnie Chen MRN: [id number] DOB/AGE: 03-Apr-1966 46 y.o.  Admit date: 06/15/2012 Discharge date: 06/18/2012  Admission Diagnoses:  Discharge Diagnoses:  Active Problems:  * No active hospital problems. *    Discharged Condition: good  Hospital Course: Pt underwent laparotomy with fascial dehsicence repair (see op note) Her post op course was unremarkable with an essentially nl white count and afebrile, but received 48 h oof iv abx.  By pod #2 was ambulating without difficulty and dc/d home.  Consults: None  Significant Diagnostic Studies:   Treatments: antibiotics: cefotetan  Discharge Exam: Blood pressure 130/84, pulse 60, temperature 97.6 F (36.4 C), temperature source Oral, resp. rate 18, height 5\' 2"  (1.575 m), weight 78.019 kg (172 lb), last menstrual period 05/11/2012, SpO2 96.00%. General appearance: alert, cooperative and no distress GI: soft, non-tender; bowel sounds normal; no masses,  no organomegaly and incision clean dry and intact  Disposition: 01-Home or Self Care  Discharge Orders    Future Orders Please Complete By Expires   Diet general      Increase activity slowly      Call MD for:  temperature >100.4      Call MD for:  persistant nausea and vomiting      Call MD for:  severe uncontrolled pain      Call MD for:  redness, tenderness, or signs of infection (pain, swelling, redness, odor or green/yellow discharge around incision site)      Call MD for:  difficulty breathing, headache or visual disturbances      Discharge instructions      Comments:   Staple removal next week in office     Medication List  As of 06/18/2012  8:48 AM   TAKE these medications         albuterol 108 (90 BASE) MCG/ACT inhaler   Commonly known as: PROVENTIL HFA;VENTOLIN HFA   Inhale 2 puffs into the lungs every 6 (six) hours as needed. For asthma      cholecalciferol 1000 UNITS tablet   Commonly known as: VITAMIN D   Take 1,000  Units by mouth daily.      ferrous sulfate 325 (65 FE) MG tablet   Take 325 mg by mouth daily with breakfast.      ibuprofen 600 MG tablet   Commonly known as: ADVIL,MOTRIN   Take 1 tablet (600 mg total) by mouth every 6 (six) hours as needed (mild pain).      ibuprofen 600 MG tablet   Commonly known as: ADVIL,MOTRIN   Take 1 tablet (600 mg total) by mouth every 6 (six) hours as needed (mild pain).      multivitamin with minerals tablet   Take 1 tablet by mouth daily.      oxyCODONE-acetaminophen 5-325 MG per tablet   Commonly known as: PERCOCET/ROXICET   Take 1-2 tablets by mouth every 3 (three) hours as needed (moderate to severe pain (when tolerating fluids)).      oxyCODONE-acetaminophen 5-325 MG per tablet   Commonly known as: PERCOCET/ROXICET   Take 1-2 tablets by mouth every 4 (four) hours as needed (moderate to severe pain (when tolerating fluids)).             Signed: Raunel Dimartino C 06/18/2012, 8:48 AM

## 2012-06-18 NOTE — Progress Notes (Deleted)
3 Days Post-Op Procedure(s) (LRB): ABDOMINAL WOUND DEHISCENCE (N/A)  Subjective: Patient reports tolerating PO, + flatus, + BM and no problems voiding.    Objective: I have reviewed patient's vital signs, intake and output, medications and labs.  General: alert, appears stated age and no distress GI: soft, non-tender; bowel sounds normal; no masses,  no organomegaly and incision: clean, dry and intact  Assessment: s/p Procedure(s) (LRB): ABDOMINAL WOUND DEHISCENCE (N/A): stable  Plan: Discharge home  LOS: 3 days    Bonnie Chen C 06/18/2012, 8:50 AM

## 2012-06-18 NOTE — Progress Notes (Signed)
3 Days Post-Op Procedure(s) (LRB): ABDOMINAL WOUND DEHISCENCE (N/A)  Subjective: Patient reports incisional pain, tolerating PO, + flatus, + BM and no problems voiding.    Objective: I have reviewed patient's vital signs, intake and output, medications and labs.  General: alert, cooperative and no distress GI: soft, non-tender; bowel sounds normal; no masses,  no organomegaly and incision: clean, dry and intact  Assessment: s/p Procedure(s) (LRB): ABDOMINAL WOUND DEHISCENCE (N/A): stable and progressing well  Plan: Discharge home  LOS: 3 days    Elwood Bazinet C 06/18/2012, 8:44 AM

## 2012-09-16 ENCOUNTER — Emergency Department (INDEPENDENT_AMBULATORY_CARE_PROVIDER_SITE_OTHER)
Admission: EM | Admit: 2012-09-16 | Discharge: 2012-09-16 | Disposition: A | Discharge status: Home / Self Care | Payer: BC Managed Care – PPO | Source: Home / Self Care

## 2012-09-16 ENCOUNTER — Emergency Department (INDEPENDENT_AMBULATORY_CARE_PROVIDER_SITE_OTHER): Payer: BC Managed Care – PPO

## 2012-09-16 DIAGNOSIS — S92403A Displaced unspecified fracture of unspecified great toe, initial encounter for closed fracture: Secondary | ICD-10-CM

## 2012-09-16 DIAGNOSIS — S92919A Unspecified fracture of unspecified toe(s), initial encounter for closed fracture: Secondary | ICD-10-CM

## 2012-09-16 DIAGNOSIS — S90129A Contusion of unspecified lesser toe(s) without damage to nail, initial encounter: Secondary | ICD-10-CM

## 2012-09-16 DIAGNOSIS — M79609 Pain in unspecified limb: Secondary | ICD-10-CM

## 2012-09-16 DIAGNOSIS — W208XXA Other cause of strike by thrown, projected or falling object, initial encounter: Secondary | ICD-10-CM

## 2012-09-16 DIAGNOSIS — M7989 Other specified soft tissue disorders: Secondary | ICD-10-CM

## 2012-09-16 MED ORDER — IBUPROFEN 800 MG PO TABS: 800.0000 mg | ORAL_TABLET | Freq: Four times a day (QID) | ORAL | Status: AC | PRN

## 2012-09-16 NOTE — ED Provider Notes (Signed)
History     CSN: [id number]  Arrival date & time 09/16/12  1622   First MD Initiated Contact with Patient 09/16/12 1623      Chief Complaint  Patient presents with  . Toe Injury    yesterday   Patient is a 46 y.o. female presenting with lower extremity pain.  Foot Pain This is a new problem. The current episode started yesterday (Pt dropped 35lb weight on L foot/great toe yesterday. ). The problem occurs constantly. The symptoms are aggravated by standing and walking (weight bearing ). The symptoms are relieved by rest and NSAIDs. She has tried rest (NSAIDs) for the symptoms. The treatment provided moderate relief.    Past Medical History  Diagnosis Date  . Asthma   . Anemia   . Heart murmur   . Headache   . Anxiety     panic disorder  . Uterine fibroids affecting pregnancy   . PONV (postoperative nausea and vomiting)     Past Surgical History  Procedure Date  . Cesarean section     x 3  . Uterine fibroid embolization 2009  . Abdominal hysterectomy 06/08/2012    Procedure: HYSTERECTOMY ABDOMINAL;  Surgeon: Turner Daniels, MD;  Location: [facility];  Service: Gynecology;  Laterality: N/A;  . Abdominal wound dehiscence 06/15/2012    Procedure: ABDOMINAL WOUND DEHISCENCE;  Surgeon: Turner Daniels, MD;  Location: [facility];  Service: Gynecology;  Laterality: N/A;  Repair of fascial dehiscence    Family History  Problem Relation Age of Onset  . Other Neg Hx   . Hyperlipidemia Mother   . Stroke Father   . Heart attack Father   . Cancer Father     lung  . Diabetes Father     History  Substance Use Topics  . Smoking status: Former Smoker -- 0.5 packs/day    Types: Cigarettes    Quit date: 06/08/2012  . Smokeless tobacco: Not on file  . Alcohol Use: No    OB History    Grav Para Term Preterm Abortions TAB SAB Ect Mult Living   7 3 3  4 3 1   3       Review of Systems  All other systems reviewed and are negative.    Allergies  Review of patient's allergies indicates  no known allergies.  Home Medications   Current Outpatient Rx  Name Route Sig Dispense Refill  . ALBUTEROL SULFATE HFA 108 (90 BASE) MCG/ACT IN AERS Inhalation Inhale 2 puffs into the lungs every 6 (six) hours as needed. For asthma    . VITAMIN D 1000 UNITS PO TABS Oral Take 1,000 Units by mouth daily.    Marland Kitchen FERROUS SULFATE 325 (65 FE) MG PO TABS Oral Take 325 mg by mouth daily with breakfast.    . IBUPROFEN 800 MG PO TABS Oral Take 1 tablet (800 mg total) by mouth every 6 (six) hours as needed for pain. 60 tablet 0  . MULTI-VITAMIN/MINERALS PO TABS Oral Take 1 tablet by mouth daily.      Pulse 65  Temp 98.1 F (36.7 C) (Oral)  Resp 18  Ht 5\' 3"  (1.6 m)  Wt 162 lb (73.483 kg)  BMI 28.70 kg/m2  SpO2 99%  LMP 05/11/2012  Physical Exam  Constitutional: She is oriented to person, place, and time. She appears well-developed and well-nourished.  HENT:  Head: Normocephalic and atraumatic.  Eyes: Conjunctivae normal are normal. Pupils are equal, round, and reactive to light.  Neck: Normal range  of motion. Neck supple.  Cardiovascular: Normal rate and regular rhythm.   Pulmonary/Chest: Effort normal and breath sounds normal.  Abdominal: Soft. Bowel sounds are normal.  Musculoskeletal:       Feet:  Neurological: She is alert and oriented to person, place, and time.  Skin: Skin is warm.    ED Course  Procedures (including critical care time)  Labs Reviewed - No data to display Dg Foot Complete Left  09/16/2012  *RADIOLOGY REPORT*  Clinical Data: First toe injury, pain, swelling, bruising  LEFT FOOT - COMPLETE 3+ VIEW  Comparison: None.  Findings: Subtle lucency through the left great toe distal phalangeal tuft suspicious for a nondisplaced fracture beneath the toenail.  Normal alignment.  No other fractures evident.  Preserved joint spaces.  Mild soft tissue swelling.  No radiopaque foreign body.  IMPRESSION: Findings suspicious for a nondisplaced fracture of the left great toe  distal phalangeal tuft   Original Report Authenticated By: Judie Petit. TREVOR Miles Costain, M.D.      1. Fracture of great toe       MDM  Affected area buddy taped and placed in post op shoe.  Weight bearing as tolerated.  Plan for follow up with sports medicine in 24-48 hours.     The patient and/or caregiver has been counseled thoroughly with regard to treatment plan and/or medications prescribed including dosage, schedule, interactions, rationale for use, and possible side effects and they verbalize understanding. Diagnoses and expected course of recovery discussed and will return if not improved as expected or if the condition worsens. Patient and/or caregiver verbalized understanding.               Doree Albee, MD 09/16/12 2002

## 2012-09-16 NOTE — ED Notes (Signed)
Bonnie Chen complains of pain in her left first toe. She dropped a 35 lb weight on her toe last night. The pain was a 10/10 stabbing and throbbing. The area is red and swollen. She did apply ice.

## 2012-09-17 ENCOUNTER — Encounter: Payer: Self-pay | Admitting: Sports Medicine

## 2012-09-17 ENCOUNTER — Ambulatory Visit: Payer: BC Managed Care – PPO

## 2012-09-17 ENCOUNTER — Ambulatory Visit (INDEPENDENT_AMBULATORY_CARE_PROVIDER_SITE_OTHER): Payer: BC Managed Care – PPO | Admitting: Sports Medicine

## 2012-09-17 ENCOUNTER — Other Ambulatory Visit: Payer: Self-pay | Admitting: Sports Medicine

## 2012-09-17 ENCOUNTER — Ambulatory Visit (INDEPENDENT_AMBULATORY_CARE_PROVIDER_SITE_OTHER): Payer: BC Managed Care – PPO

## 2012-09-17 VITALS — BP 118/78 | HR 71 | Wt 163.0 lb

## 2012-09-17 DIAGNOSIS — M222X9 Patellofemoral disorders, unspecified knee: Secondary | ICD-10-CM | POA: Insufficient documentation

## 2012-09-17 DIAGNOSIS — M25569 Pain in unspecified knee: Secondary | ICD-10-CM

## 2012-09-17 DIAGNOSIS — M545 Low back pain, unspecified: Secondary | ICD-10-CM

## 2012-09-17 DIAGNOSIS — R52 Pain, unspecified: Secondary | ICD-10-CM

## 2012-09-17 DIAGNOSIS — S92402A Displaced unspecified fracture of left great toe, initial encounter for closed fracture: Secondary | ICD-10-CM | POA: Insufficient documentation

## 2012-09-17 DIAGNOSIS — S92919A Unspecified fracture of unspecified toe(s), initial encounter for closed fracture: Secondary | ICD-10-CM

## 2012-09-17 NOTE — Assessment & Plan Note (Addendum)
Formal physical therapy. Tylenol as above. We'll revisit this in 4 weeks as well. We'll consider MRI if no better.

## 2012-09-17 NOTE — Assessment & Plan Note (Addendum)
Formal physical therapy to focus on VMO, and hip abductors. Patellar stabilizing knee brace. Tylenol as needed.  Avoiding NSAIDs with her fracture We'll see her back in 4 weeks for this.  If continued pain after formal physical therapy, we can certainly consider an intra-articular corticosteroid.

## 2012-09-17 NOTE — Progress Notes (Signed)
Subjective:    I'm seeing this patient as a consultation for:  Dr. Cathren Harsh  CC: Followup toe fracture, 2 new complaints.  HPI: Left great toe fracture: Dropped a weight on the toe, went to urgent care, Buddy taped, and placed in a postop shoe was noted to have a nondisplaced fracture through the distal phalanx, extra-articular. Overall this is doing well, there is some bruising, and pain but it's localized. She's been using Motrin.  Low back pain: Present for a long time, runs up and down the paralumbar muscles, worse with bending forward, worse with laying in bed at night. All axial, without radiation down legs, no bowel, or bladder dysfunction. Has never had imaging, therapy, or use any medications for this.  Knee pain: Bilateral, left worse than right. Worse when going up and down stairs, and localized in the anterior knee. She also gets some grinding and she takes her knee through the range of motion. No discrete trauma, no swelling, no mechanical symptoms.  Past medical history, Surgical history, Family history, Social history, Allergies, and medications have been entered into the medical record, reviewed, and no changes needed.   Review of Systems: No headache, visual changes, nausea, vomiting, diarrhea, constipation, dizziness, abdominal pain, skin rash, fevers, chills, night sweats, weight loss, swollen lymph nodes, body aches, joint swelling, muscle aches, chest pain, or shortness of breath.   Objective:   Vitals:  Afebrile, vital signs stable. General: Well Developed, well nourished, and in no acute distress.  Neuro/Psych: Alert and oriented x3, extra-ocular muscles intact, able to move all 4 extremities.  Skin: Warm and dry, no rashes noted.  Respiratory: Not using accessory muscles, speaking in full sentences, trachea midline.  Cardiovascular: Pulses palpable, no extremity edema. Abdomen: Does not appear distended. Back Exam:  Inspection: She has fairly accentuated lumbar  lordosis.  Motion: Flexion 45 deg, Extension 45 deg, Side Bending to 45 deg bilaterally,  Rotation to 45 deg bilaterally  SLR laying: Negative  XSLR laying: Negative  Palpable tenderness: None. FABER: negative. Sensory change: Gross sensation intact to all lumbar and sacral dermatomes.  Reflexes: 2+ at both patellar tendons, 2+ at achilles tendons, Babinski's downgoing.  Strength at foot  Plantar-flexion: 5/5 Dorsi-flexion: 5/5 Eversion: 5/5 Inversion: 5/5  Leg strength  Quad: 5/5 Hamstring: 5/5 Hip flexor: 5/5 Hip abductors: 5/5  Gait unremarkable.  Left Knee:  Normal to inspection with no erythema or effusion or obvious bony abnormalities. Palpation normal with no warmth, joint line tenderness, patellar tenderness, or condyle tenderness. ROM full in flexion and extension and lower leg rotation. Ligaments with solid consistent endpoints including ACL, PCL, LCL, MCL. Negative Mcmurray's, Apley's, and Thessalonian tests. Mildly painful patellar compression with crepitus at the knees taken through the range of motion.  Patellar and quadriceps tendons unremarkable. Hamstring and quadriceps strength is normal.   Hip abductor strength on the left is significantly weaker than the right.  Left great toe is tender to palpation along the distal phalanx, there is significant bruising, but she is neurovascularly intact distally.  I did review her x-rays, a short nondisplaced transverse fracture through the distal phalanx tuft of the left great toe.  Impression and Recommendations:   This case required medical decision making of moderate complexity.

## 2012-09-17 NOTE — Assessment & Plan Note (Addendum)
Continue compression, and postop shoe. This will heal well. Expect 6 weeks for resolution. No x-rays needed at subsequent visit.  I billed a fracture code for this visit, all subsequent visits for this complaint will be "post-op checks" in the global period. If we do revisit her low back pain, and knee pain at subsequent visits, modifier 24 will have to be added to the E&M Code.

## 2012-09-18 ENCOUNTER — Ambulatory Visit (INDEPENDENT_AMBULATORY_CARE_PROVIDER_SITE_OTHER): Payer: BC Managed Care – PPO

## 2012-09-18 ENCOUNTER — Encounter: Payer: Self-pay | Admitting: *Deleted

## 2012-09-18 DIAGNOSIS — M549 Dorsalgia, unspecified: Secondary | ICD-10-CM

## 2012-09-23 ENCOUNTER — Encounter: Payer: Self-pay | Admitting: *Deleted

## 2014-10-03 ENCOUNTER — Encounter: Payer: Self-pay | Admitting: Sports Medicine

## 2015-05-15 ENCOUNTER — Ambulatory Visit (INDEPENDENT_AMBULATORY_CARE_PROVIDER_SITE_OTHER): Payer: Medicaid Other | Admitting: Sports Medicine

## 2015-05-15 ENCOUNTER — Encounter: Payer: Self-pay | Admitting: Sports Medicine

## 2015-05-15 VITALS — BP 126/82 | HR 58 | Ht 62.0 in | Wt 185.0 lb

## 2015-05-15 DIAGNOSIS — E669 Obesity, unspecified: Secondary | ICD-10-CM | POA: Diagnosis not present

## 2015-05-15 DIAGNOSIS — M722 Plantar fascial fibromatosis: Secondary | ICD-10-CM | POA: Insufficient documentation

## 2015-05-15 DIAGNOSIS — M222X9 Patellofemoral disorders, unspecified knee: Secondary | ICD-10-CM | POA: Diagnosis not present

## 2015-05-15 MED ORDER — PHENTERMINE HCL 37.5 MG PO TABS: ORAL_TABLET | ORAL | Status: DC

## 2015-05-15 MED ORDER — MELOXICAM 15 MG PO TABS: ORAL_TABLET | ORAL | Status: DC

## 2015-05-15 NOTE — Assessment & Plan Note (Signed)
After a prolonged discussion, patient will start phentermine. She is in the obese category.  Return monthly for weight checks and refills.

## 2015-05-15 NOTE — Assessment & Plan Note (Signed)
Never did physical therapy. Weak vastus medialis, weak Abductors. Meloxicam. Return in 4 weeks

## 2015-05-15 NOTE — Assessment & Plan Note (Signed)
Formal physical therapy. Meloxicam. Return for custom orthotics.

## 2015-05-15 NOTE — Progress Notes (Signed)
  Subjective:    CC: bilateral knee pain  HPI: This is a pleasant 49 year old female, I saw her 3 years ago with patellofemoral pain syndrome, she never did any of the rehabilitation exercises and returns with persistent pain.  Plantar fasciitis: Bilateral, present for months. Has never done orthotics or rehabilitation exercises.  Obesity: Amenable to try weight loss medication. She has tried dieting, exercise,nothing works.  Past medical history, Surgical history, Family history not pertinant except as noted below, Social history, Allergies, and medications have been entered into the medical record, reviewed, and no changes needed.   Review of Systems: No fevers, chills, night sweats, weight loss, chest pain, or shortness of breath.   Objective:    General: Well Developed, well nourished, and in no acute distress.  Neuro: Alert and oriented x3, extra-ocular muscles intact, sensation grossly intact.  HEENT: Normocephalic, atraumatic, pupils equal round reactive to light, neck supple, no masses, no lymphadenopathy, thyroid nonpalpable.  Skin: Warm and dry, no rashes. Cardiac: Regular rate and rhythm, no murmurs rubs or gallops, no lower extremity edema.  Respiratory: Clear to auscultation bilaterally. Not using accessory muscles, speaking in full sentences. Bilateral knees: Normal to inspection with no erythema or effusion or obvious bony abnormalities. Tender to palpation of the medial and lateral patellar facets bilaterally. ROM normal in flexion and extension and lower leg rotation. Ligaments with solid consistent endpoints including ACL, PCL, LCL, MCL. Negative Mcmurray's and provocative meniscal tests. Non painful patellar compression. Patellar and quadriceps tendons unremarkable. Poor vastus medialis definition, only mildly weak hip abductor's bilaterally.  Impression and Recommendations:    I spent 40 minutes with this patient, 50% was face-to-face time counseling regarding  the above diagnoses.

## 2015-06-12 ENCOUNTER — Ambulatory Visit: Payer: Medicaid Other | Admitting: Sports Medicine

## 2015-06-13 ENCOUNTER — Ambulatory Visit: Payer: Medicaid Other | Attending: Sports Medicine

## 2017-04-11 ENCOUNTER — Emergency Department
Admission: EM | Admit: 2017-04-11 | Discharge: 2017-04-11 | Disposition: A | Discharge status: Home / Self Care | Payer: BLUE CROSS/BLUE SHIELD | Source: Home / Self Care | Attending: Family Medicine | Admitting: Family Medicine

## 2017-04-11 ENCOUNTER — Encounter: Payer: Self-pay | Admitting: Emergency Medicine

## 2017-04-11 DIAGNOSIS — J069 Acute upper respiratory infection, unspecified: Secondary | ICD-10-CM | POA: Diagnosis not present

## 2017-04-11 LAB — POCT INFLUENZA A/B
INFLUENZA A, POC: NEGATIVE
INFLUENZA B, POC: NEGATIVE

## 2017-04-11 LAB — POCT RAPID STREP A (OFFICE): RAPID STREP A SCREEN: NEGATIVE

## 2017-04-11 MED ORDER — SULFAMETHOXAZOLE-TRIMETHOPRIM 800-160 MG PO TABS: 1.0000 | ORAL_TABLET | Freq: Two times a day (BID) | ORAL | 0 refills | Status: DC

## 2017-04-11 NOTE — ED Provider Notes (Signed)
[facility]    CSN: [id number] Arrival date & time: 04/11/17  1917     History   Chief Complaint Chief Complaint  Patient presents with  . Nasal Congestion    HPI Bonnie Chen is a 51 y.o. female.   Patient reports that she developed a migraine headache one week ago (now resolved).  Two days ago she was exposed to a coworker with the flu.  Today she developed a burning sensation in her nose, mild sore throat, and myalgias.  She feels hot, but no definite fever.  She has a history of seasonal rhinitis.   The history is provided by the patient.    Past Medical History:  Diagnosis Date  . Anemia   . Anxiety    panic disorder  . Asthma   . Headache(784.0)   . Heart murmur   . PONV (postoperative nausea and vomiting)   . Uterine fibroids affecting pregnancy     Patient Active Problem List   Diagnosis Date Noted  . Plantar fasciitis, bilateral 05/15/2015  . Obesity 05/15/2015  . Left Patellofemoral pain syndrome 09/17/2012  . Low back pain 09/17/2012    Past Surgical History:  Procedure Laterality Date  . ABDOMINAL HYSTERECTOMY  06/08/2012   Procedure: HYSTERECTOMY ABDOMINAL;  Surgeon: Luz Lex, MD;  Location: [facility];  Service: Gynecology;  Laterality: N/A;  . ABDOMINAL WOUND DEHISCENCE  06/15/2012   Procedure: ABDOMINAL WOUND DEHISCENCE;  Surgeon: Luz Lex, MD;  Location: [facility];  Service: Gynecology;  Laterality: N/A;  Repair of fascial dehiscence  . CESAREAN SECTION     x 3  . UTERINE FIBROID EMBOLIZATION  2009    OB History    Gravida Para Term Preterm AB Living   7 3 3   4 3    SAB TAB Ectopic Multiple Live Births   1 3             Home Medications    Prior to Admission medications   Medication Sig Start Date End Date Taking? Authorizing Provider  cetirizine (ZYRTEC) 5 MG tablet Take 5 mg by mouth daily.   Yes [provider]  losartan (COZAAR) 100 MG tablet Take 100 mg by mouth daily.   Yes [provider]    acetaminophen (TYLENOL) 650 MG CR tablet Take 2 tablets (1,300 mg total) by mouth every 8 (eight) hours as needed for pain. 09/17/12   Silverio Decamp, MD  albuterol (PROVENTIL HFA;VENTOLIN HFA) 108 (90 BASE) MCG/ACT inhaler Inhale 2 puffs into the lungs every 6 (six) hours as needed. For asthma    [provider]  cholecalciferol (VITAMIN D) 1000 UNITS tablet Take 1,000 Units by mouth daily.    [provider]  ferrous sulfate 325 (65 FE) MG tablet Take 325 mg by mouth daily with breakfast.    [provider]  ibuprofen (ADVIL,MOTRIN) 800 MG tablet Take 1 tablet (800 mg total) by mouth every 6 (six) hours as needed for pain. 09/16/12   Deneise Lever, MD  Multiple Vitamins-Minerals (MULTIVITAMIN WITH MINERALS) tablet Take 1 tablet by mouth daily.    [provider]  sulfamethoxazole-trimethoprim (BACTRIM DS) 800-160 MG tablet Take 1 tablet by mouth 2 (two) times daily. (Rx void after 04/19/17) 04/11/17   Kandra Nicolas, MD    Family History Family History  Problem Relation Age of Onset  . Hyperlipidemia Mother   . Stroke Father   . Heart attack Father   . Cancer Father  lung  . Diabetes Father   . Other Neg Hx     Social History Social History  Substance Use Topics  . Smoking status: Former Smoker    Packs/day: 0.50    Types: Cigarettes    Quit date: 06/08/2012  . Smokeless tobacco: Not on file  . Alcohol use No     Allergies   Ciprocinonide [fluocinolone]   Review of Systems Review of Systems + sore throat No cough No pleuritic pain No wheezing + nasal congestion + post-nasal drainage No sinus pain/pressure No itchy/red eyes No earache No hemoptysis No SOB No fever, + chills No nausea No vomiting No abdominal pain No diarrhea No urinary symptoms No skin rash + fatigue + myalgias + headache Used OTC meds without relief   Physical Exam Triage Vital Signs ED Triage Vitals  Enc Vitals Group     BP  04/11/17 1952 133/79     Pulse Rate 04/11/17 1952 62     Resp --      Temp 04/11/17 1952 98.4 F (36.9 C)     Temp Source 04/11/17 1952 Oral     SpO2 04/11/17 1952 98 %     Weight 04/11/17 1953 188 lb (85.3 kg)     Height 04/11/17 1953 5\' 2"  (1.575 m)     Head Circumference --      Peak Flow --      Pain Score 04/11/17 1953 0     Pain Loc --      Pain Edu? --      Excl. in [place]? --    No data found.   Updated Vital Signs BP 133/79 (BP Location: Left Arm)   Pulse 62   Temp 98.4 F (36.9 C) (Oral)   Ht 5\' 2"  (1.575 m)   Wt 188 lb (85.3 kg)   LMP 05/11/2012   SpO2 98%   BMI 34.39 kg/m   Visual Acuity Right Eye Distance:   Left Eye Distance:   Bilateral Distance:    Right Eye Near:   Left Eye Near:    Bilateral Near:     Physical Exam Nursing notes and Vital Signs reviewed. Appearance:  Patient appears stated age, and in no acute distress Eyes:  Pupils are equal, round, and reactive to light and accomodation.  Extraocular movement is intact.  Conjunctivae are not inflamed  Ears:  Canals normal.  Tympanic membranes normal.  Nose:  Mildly congested turbinates.  No sinus tenderness.   Pharynx:  Normal Neck:  Supple.  Tender enlarged posterior/lateral nodes are palpated bilaterally  Lungs:  Clear to auscultation.  Breath sounds are equal.  Moving air well. Heart:  Regular rate and rhythm without murmurs, rubs, or gallops.  Abdomen:  Nontender without masses or hepatosplenomegaly.  Bowel sounds are present.  No CVA or flank tenderness.  Extremities:  No edema.  Skin:  No rash present.    UC Treatments / Results  Labs (all labs ordered are listed, but only abnormal results are displayed) Labs Reviewed  POCT RAPID STREP A (OFFICE) negative  POCT INFLUENZA A/B negative    EKG  EKG Interpretation None       Radiology No results found.  Procedures Procedures (including critical care time)  Medications Ordered in UC Medications - No data to  display   Initial Impression / Assessment and Plan / UC Course  I have reviewed the triage vital signs and the nursing notes.  Pertinent labs & imaging results that were available during my  care of the patient were reviewed by me and considered in my medical decision making (see chart for details).    There is no evidence of bacterial infection today.   Take plain guaifenesin (1200mg  extended release tabs such as Mucinex) twice daily, with plenty of water, for cough and congestion.  Get adequate rest.   Also recommend using saline nasal spray several times daily and saline nasal irrigation (AYR is a common brand).   Try warm salt water gargles for sore throat.  Stop all antihistamines for now, and other non-prescription cough/cold preparations. May take Delsym Cough Suppressant at bedtime for nighttime cough.  Begin Bactrim DS if not improving about one week or if persistent fever develops (Given a prescription to hold, with an expiration date)  Follow-up with family doctor if not improving about10 days.     Final Clinical Impressions(s) / UC Diagnoses   Final diagnoses:  Viral URI    New Prescriptions New Prescriptions   SULFAMETHOXAZOLE-TRIMETHOPRIM (BACTRIM DS) 800-160 MG TABLET    Take 1 tablet by mouth 2 (two) times daily. (Rx void after 04/19/17)     Kandra Nicolas, MD 04/19/17 [phone]

## 2017-04-11 NOTE — ED Triage Notes (Signed)
Today Congestion, throat irritation, nose burns, she does has allergic rhinitis  Was exposed to positive flu,and  strep 2days ago from co-workers and child. Says upper arms and neck are sore but she did upper body work at gym yesterday.

## 2017-04-11 NOTE — Discharge Instructions (Signed)
Take plain guaifenesin (1200mg  extended release tabs such as Mucinex) twice daily, with plenty of water, for cough and congestion.  Get adequate rest.   Also recommend using saline nasal spray several times daily and saline nasal irrigation (AYR is a common brand).   Try warm salt water gargles for sore throat.  Stop all antihistamines for now, and other non-prescription cough/cold preparations. May take Delsym Cough Suppressant at bedtime for nighttime cough.  Begin Bactrim DS if not improving about one week or if persistent fever develops   Follow-up with family doctor if not improving about10 days.

## 2017-12-15 ENCOUNTER — Ambulatory Visit (INDEPENDENT_AMBULATORY_CARE_PROVIDER_SITE_OTHER): Payer: BLUE CROSS/BLUE SHIELD | Admitting: Family Medicine

## 2017-12-15 ENCOUNTER — Encounter: Payer: Self-pay | Admitting: Family Medicine

## 2017-12-15 VITALS — BP 152/100 | HR 60 | Ht 63.0 in | Wt 191.0 lb

## 2017-12-15 DIAGNOSIS — M25571 Pain in right ankle and joints of right foot: Secondary | ICD-10-CM

## 2017-12-15 MED ORDER — COLCHICINE 0.6 MG PO TABS: 0.6000 mg | ORAL_TABLET | Freq: Every day | ORAL | 1 refills | Status: AC

## 2017-12-15 MED ORDER — DICLOFENAC SODIUM 1 % TD GEL
4.0000 g | Freq: Four times a day (QID) | TRANSDERMAL | 11 refills | Status: AC
Start: 2017-12-15 — End: ?

## 2017-12-15 NOTE — Patient Instructions (Addendum)
Thank you for coming in today. 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  Use compression  Use diclofenac gel 4x daily for pain.  Take 2 aleve twice daily as needed for pain.  Apply Ice for 20 mins.   Get xray   Start exercises for achilles tendonitis  Add Colchicine for possible gout.  Take it for 1 month.

## 2017-12-15 NOTE — Progress Notes (Signed)
Bonnie Chen is a 52 y.o. female who presents to [facility] today for right Achilles pain.  Bonnie Chen was exercising normally yesterday on an exercise bike.  She felt pain in her distal Achilles area and posterior calcaneus following her workout.  Pain worsened and was associated with swelling to today.  She notes quite significant pain especially with ambulation.  She cannot recall any specific injury or a popping sensation in her ankle.  She is tried compression ice elevation brace which helped a little.  She notes the only changes that for the last month she has been eating a ketogenic diet which has helped her lose a bit of weight.   Past Medical History:  Diagnosis Date  . Anemia   . Anxiety    panic disorder  . Asthma   . Headache(784.0)   . Heart murmur   . PONV (postoperative nausea and vomiting)   . Uterine fibroids affecting pregnancy    Past Surgical History:  Procedure Laterality Date  . ABDOMINAL HYSTERECTOMY  06/08/2012   Procedure: HYSTERECTOMY ABDOMINAL;  Surgeon: Bonnie Lex, MD;  Location: [facility];  Service: Gynecology;  Laterality: N/A;  . ABDOMINAL WOUND DEHISCENCE  06/15/2012   Procedure: ABDOMINAL WOUND DEHISCENCE;  Surgeon: Bonnie Lex, MD;  Location: [facility];  Service: Gynecology;  Laterality: N/A;  Repair of fascial dehiscence  . CESAREAN SECTION     x 3  . UTERINE FIBROID EMBOLIZATION  2009   Social History   Tobacco Use  . Smoking status: Former Smoker    Packs/day: 0.50    Types: Cigarettes    Last attempt to quit: 06/08/2012    Years since quitting: 5.5  . Smokeless tobacco: Never Used  Substance Use Topics  . Alcohol use: No     ROS:  As above   Medications: Current Outpatient Medications  Medication Sig Dispense Refill  . acetaminophen (TYLENOL) 650 MG CR tablet Take 2 tablets (1,300 mg total) by mouth every 8 (eight) hours as needed for pain. 90 tablet 3  . albuterol (PROVENTIL HFA;VENTOLIN  HFA) 108 (90 BASE) MCG/ACT inhaler Inhale 2 puffs into the lungs every 6 (six) hours as needed. For asthma    . cetirizine (ZYRTEC) 5 MG tablet Take 5 mg by mouth daily.    . cholecalciferol (VITAMIN D) 1000 UNITS tablet Take 1,000 Units by mouth daily.    . ferrous sulfate 325 (65 FE) MG tablet Take 325 mg by mouth daily with breakfast.    . ibuprofen (ADVIL,MOTRIN) 800 MG tablet Take 1 tablet (800 mg total) by mouth every 6 (six) hours as needed for pain. 60 tablet 0  . losartan (COZAAR) 100 MG tablet Take 100 mg by mouth daily.    . Multiple Vitamins-Minerals (MULTIVITAMIN WITH MINERALS) tablet Take 1 tablet by mouth daily.    . colchicine 0.6 MG tablet Take 1 tablet (0.6 mg total) by mouth daily. 30 tablet 1  . diclofenac sodium (VOLTAREN) 1 % GEL Apply 4 g topically 4 (four) times daily. To affected joint. 100 g 11   No current facility-administered medications for this visit.    Allergies  Allergen Reactions  . Ciprocinonide [Fluocinolone] Nausea And Vomiting     Exam:  BP (!) 152/100   Pulse 60   Ht 5\' 3"  (1.6 m)   Wt 191 lb (86.6 kg)   LMP 05/11/2012   BMI 33.83 kg/m  General: Well Developed, well nourished, and in no acute  distress.  Neuro/Psych: Alert and oriented x3, extra-ocular muscles intact, able to move all 4 extremities, sensation grossly intact. Skin: Warm and dry, no rashes noted.  Respiratory: Not using accessory muscles, speaking in full sentences, trachea midline.  Cardiovascular: Pulses palpable, no extremity edema. Abdomen: Does not appear distended. MSK:  Right leg and ankle Slightly swollen at the posterior calcaneus.  No Significant ankle effusion. Ankle motion is normal Tender palpation at the posterior calcaneus at the insertion of the Achilles tendon. Strength is intact over painful to resisted foot plantar flexion. Pulses capillary refill and sensation are intact  Limited musculoskeletal ultrasound of the right posterior ankle reveals an intact  Achilles tendon with no tears.  The posterior tibialis tendon and peroneal tendons are also intact.  A small hypoechoic fluid collection surrounding the posterior tibialis tendons and peroneal tendon sheaths.  Bony structures are normal  Xray pending.   No results found for this or any previous visit (from the past 48 hour(s)). No results found.    Assessment and Plan: 52 y.o. female with  Right ankle pain.  Etiology is unclear at this time however I am suspicious for internal Achilles tendinopathy.  Treat with relative rest diclofenac gel and Cam walker boot.  Bonnie Chen has recently been eating a Keto Diet which does increase uric acid levels. Gout is a possibility here. Will also treat with colchicine.   Follow up if not improving.     Orders Placed This Encounter  Procedures  . DG Ankle Complete Right    Standing Status:   Future    Standing Expiration Date:   02/13/2019    Order Specific Question:   Reason for Exam (SYMPTOM  OR DIAGNOSIS REQUIRED)    Answer:   eval ankle pain    Order Specific Question:   Is patient pregnant?    Answer:   No    Order Specific Question:   Preferred imaging location?    Answer:   [facility]    Order Specific Question:   Radiology Contrast Protocol - do NOT remove file path    Answer:   [email or web address]   Meds ordered this encounter  Medications  . diclofenac sodium (VOLTAREN) 1 % GEL    Sig: Apply 4 g topically 4 (four) times daily. To affected joint.    Dispense:  100 g    Refill:  11  . colchicine 0.6 MG tablet    Sig: Take 1 tablet (0.6 mg total) by mouth daily.    Dispense:  30 tablet    Refill:  1    Discussed warning signs or symptoms. Please see discharge instructions. Patient expresses understanding.

## 2017-12-16 ENCOUNTER — Institutional Professional Consult (permissible substitution): Payer: BLUE CROSS/BLUE SHIELD | Admitting: Family Medicine

## 2018-06-18 ENCOUNTER — Emergency Department (INDEPENDENT_AMBULATORY_CARE_PROVIDER_SITE_OTHER): Payer: BLUE CROSS/BLUE SHIELD

## 2018-06-18 ENCOUNTER — Encounter: Payer: Self-pay | Admitting: Emergency Medicine

## 2018-06-18 ENCOUNTER — Emergency Department
Admission: EM | Admit: 2018-06-18 | Discharge: 2018-06-18 | Disposition: A | Discharge status: Home / Self Care | Payer: BLUE CROSS/BLUE SHIELD | Source: Home / Self Care | Attending: Family Medicine | Admitting: Family Medicine

## 2018-06-18 ENCOUNTER — Other Ambulatory Visit: Payer: Self-pay

## 2018-06-18 DIAGNOSIS — R200 Anesthesia of skin: Secondary | ICD-10-CM | POA: Diagnosis not present

## 2018-06-18 DIAGNOSIS — R079 Chest pain, unspecified: Secondary | ICD-10-CM

## 2018-06-18 DIAGNOSIS — M542 Cervicalgia: Secondary | ICD-10-CM

## 2018-06-18 DIAGNOSIS — F4321 Adjustment disorder with depressed mood: Secondary | ICD-10-CM

## 2018-06-18 NOTE — ED Triage Notes (Signed)
Right neck and shoulder pain on Monday then again today. Patient state she is under a lot of stress at work, she just lost a family member, she's very depressed, sad, anxious, tearful.

## 2018-06-18 NOTE — ED Provider Notes (Signed)
[facility]    CSN: [id number] Arrival date & time: 06/18/18  1436     History   Chief Complaint Chief Complaint  Patient presents with  . Neck Pain    HPI Bonnie Chen is a 52 y.o. female.   HPI Bonnie Chen is a 52 y.o. female presenting to UC with c/o Right sided neck/shoulder and upper chest pain that has been constant the last 3 days.  Pain is a dull ache mild to moderate in severity. Associated generalized HA.  She has not tried anything for the pain. She has been under a lot of stress at work recently and also reports her cousin recently died of a heart attack in their 45s. Pt does report hx of anxiety and depression last year and is not sure if that is what is causing her symptoms this week. She cannot f/u with her PCP until Monday.  She was on an as needed anxiety medication last year but has not needed to take it since then and does not recall the name of the medication.  Denies centralized chest pain, SOB, dizziness, nausea, vomiting or diaphoresis. She notes over the last few days or weeks she has felt more down, fatigued, and unmotivated.  She has not discussed her depression with her PCP because she does not want to have to take medication for it or be "labeled with something."  Denies thoughts of hurting herself or others. She does have a hx of high blood pressure and high cholesterol but no hx of personal heart disease.    Past Medical History:  Diagnosis Date  . Anemia   . Anxiety    panic disorder  . Asthma   . Headache(784.0)   . Heart murmur   . PONV (postoperative nausea and vomiting)   . Uterine fibroids affecting pregnancy     Patient Active Problem List   Diagnosis Date Noted  . Plantar fasciitis, bilateral 05/15/2015  . Obesity 05/15/2015  . Left Patellofemoral pain syndrome 09/17/2012  . Low back pain 09/17/2012    Past Surgical History:  Procedure Laterality Date  . ABDOMINAL HYSTERECTOMY  06/08/2012   Procedure: HYSTERECTOMY  ABDOMINAL;  Surgeon: Luz Lex, MD;  Location: [facility];  Service: Gynecology;  Laterality: N/A;  . ABDOMINAL WOUND DEHISCENCE  06/15/2012   Procedure: ABDOMINAL WOUND DEHISCENCE;  Surgeon: Luz Lex, MD;  Location: [facility];  Service: Gynecology;  Laterality: N/A;  Repair of fascial dehiscence  . CESAREAN SECTION     x 3  . UTERINE FIBROID EMBOLIZATION  2009    OB History    Gravida  7   Para  3   Term  3   Preterm      AB  4   Living  3     SAB  1   TAB  3   Ectopic      Multiple      Live Births               Home Medications    Prior to Admission medications   Medication Sig Start Date End Date Taking? Authorizing Provider  acetaminophen (TYLENOL) 650 MG CR tablet Take 2 tablets (1,300 mg total) by mouth every 8 (eight) hours as needed for pain. 09/17/12   Silverio Decamp, MD  albuterol (PROVENTIL HFA;VENTOLIN HFA) 108 (90 BASE) MCG/ACT inhaler Inhale 2 puffs into the lungs every 6 (six) hours as needed. For asthma    [provider]  cetirizine (ZYRTEC) 5 MG tablet Take 5 mg by mouth daily.    [provider]  cholecalciferol (VITAMIN D) 1000 UNITS tablet Take 1,000 Units by mouth daily.    [provider]  colchicine 0.6 MG tablet Take 1 tablet (0.6 mg total) by mouth daily. 12/15/17   Gregor Hams, MD  diclofenac sodium (VOLTAREN) 1 % GEL Apply 4 g topically 4 (four) times daily. To affected joint. 12/15/17   Gregor Hams, MD  ferrous sulfate 325 (65 FE) MG tablet Take 325 mg by mouth daily with breakfast.    [provider]  ibuprofen (ADVIL,MOTRIN) 800 MG tablet Take 1 tablet (800 mg total) by mouth every 6 (six) hours as needed for pain. 09/16/12   Deneise Lever, MD  losartan (COZAAR) 100 MG tablet Take 100 mg by mouth daily.    [provider]  Multiple Vitamins-Minerals (MULTIVITAMIN WITH MINERALS) tablet Take 1 tablet by mouth daily.    [provider]    Family History Family History    Problem Relation Age of Onset  . Hyperlipidemia Mother   . Stroke Father   . Heart attack Father   . Cancer Father        lung  . Diabetes Father   . Other Neg Hx     Social History Social History   Tobacco Use  . Smoking status: Former Smoker    Packs/day: 0.50    Types: Cigarettes    Last attempt to quit: 06/08/2012    Years since quitting: 6.0  . Smokeless tobacco: Never Used  Substance Use Topics  . Alcohol use: No  . Drug use: No     Allergies   Ciprocinonide [fluocinolone]   Review of Systems Review of Systems  Constitutional: Negative for chills and fever.  HENT: Negative for congestion, ear pain, sore throat, trouble swallowing and voice change.   Respiratory: Negative for cough and shortness of breath.   Cardiovascular: Positive for chest pain. Negative for palpitations.  Gastrointestinal: Negative for abdominal pain, diarrhea, nausea and vomiting.  Musculoskeletal: Positive for arthralgias, back pain, myalgias and neck pain.  Skin: Negative for rash.  Neurological: Positive for headaches. Negative for dizziness and light-headedness.  Psychiatric/Behavioral: The patient is nervous/anxious.      Physical Exam Triage Vital Signs ED Triage Vitals  Enc Vitals Group     BP 06/18/18 1511 (!) 134/92     Pulse Rate 06/18/18 1511 79     Resp --      Temp 06/18/18 1511 98.7 F (37.1 C)     Temp Source 06/18/18 1511 Oral     SpO2 06/18/18 1511 100 %     Weight 06/18/18 1513 167 lb (75.8 kg)     Height 06/18/18 1513 5\' 3"  (1.6 m)     Head Circumference --      Peak Flow --      Pain Score 06/18/18 1512 2     Pain Loc --      Pain Edu? --      Excl. in [place]? --    No data found.  Updated Vital Signs BP 125/89 (BP Location: Right Arm)   Pulse 79   Temp 98.7 F (37.1 C) (Oral)   Ht 5\' 3"  (1.6 m)   Wt 167 lb (75.8 kg)   LMP 05/11/2012   SpO2 100%   BMI 29.58 kg/m   Visual Acuity Right Eye Distance:   Left Eye Distance:   Bilateral Distance:  Right Eye Near:   Left Eye Near:    Bilateral Near:     Physical Exam  Constitutional: She is oriented to person, place, and time. She appears well-developed and well-nourished.  tearful  HENT:  Head: Normocephalic and atraumatic.  Mouth/Throat: Oropharynx is clear and moist.  Eyes: EOM are normal.  Neck: Normal range of motion. Neck supple.  No midline spinal tenderness.   Cardiovascular: Normal rate and regular rhythm.  Pulmonary/Chest: Effort normal. No stridor. No respiratory distress. She has no wheezes. She has no rales. She exhibits tenderness.    Musculoskeletal: Normal range of motion. She exhibits tenderness. She exhibits no edema.  No midline spinal tenderness. Tenderness to Right upper trapezius and anterior shoulder. Full ROM Right arm with 5/5 strength  Neurological: She is alert and oriented to person, place, and time.  Skin: Skin is warm and dry. Capillary refill takes less than 2 seconds.  Psychiatric: Her behavior is normal. She exhibits a depressed mood.  tearful  Nursing note and vitals reviewed.    UC Treatments / Results  Labs (all labs ordered are listed, but only abnormal results are displayed) Labs Reviewed - No data to display  EKG Date/Time:06/18/2018  15:07:18 Ventricular Rate: 59 PR Interval: 130 QRS Duration: 84 QT Interval: 440 QTC Calculation: 435 P-R-T axes: 1   30    21  Text Interpretation: Sinus bradycardia, otherwise normal ECG No prior to compare  Repeat was performed 1 second later, no significant change on review of ECG  Radiology Dg Chest 2 View  Result Date: 06/18/2018 CLINICAL DATA:  Right shoulder numbness for 2 days EXAM: CHEST - 2 VIEW COMPARISON:  None. FINDINGS: The heart size and mediastinal contours are within normal limits. Both lungs are clear. The visualized skeletal structures are unremarkable. IMPRESSION: No active cardiopulmonary disease. Electronically Signed   By: Van Clines M.D.   On:  06/18/2018 16:04    Procedures Procedures (including critical care time)  Medications Ordered in UC Medications - No data to display  Initial Impression / Assessment and Plan / UC Course  I have reviewed the triage vital signs and the nursing notes.  Pertinent labs & imaging results that were available during my care of the patient were reviewed by me and considered in my medical decision making (see chart for details).     Hx and exam c/w anxiety and depression Pain likely due to muscle strain as pain is worse with palpation. Depression and anxiety likely due to recent death of her cousin, c/w grief reaction. Pt denies HI or SI Provided multiple counseling and behavioral health resources for pt. Strongly encouraged to f/u with PCP tomorrow if possible. Discussed symptoms that warrant emergent care in the ED.   Final Clinical Impressions(s) / UC Diagnoses   Final diagnoses:  Neck pain on right side  Right-sided chest pain  Grief reaction     Discharge Instructions      Please follow up with your family doctor on Monday as previously schedule or you may call to see if you can be placed on a wait list to be fit into the schedule tomorrow.  Please also feel free to use the resources for counseling given today for someone to speak with about how you have been feeling recently.    If symptoms worsen, worsening pain, trouble breathing, passing out, or other new concerning symptoms please call 911 or have someone take you to the closest hospital.     ED Prescriptions  None     Controlled Substance Prescriptions [place] Controlled Substance Registry consulted? Not Applicable   Tyrell Antonio 06/18/18 1757

## 2018-06-18 NOTE — Discharge Instructions (Signed)
°  Please follow up with your family doctor on Monday as previously schedule or you may call to see if you can be placed on a wait list to be fit into the schedule tomorrow.  Please also feel free to use the resources for counseling given today for someone to speak with about how you have been feeling recently.    If symptoms worsen, worsening pain, trouble breathing, passing out, or other new concerning symptoms please call 911 or have someone take you to the closest hospital.

## 2018-06-19 ENCOUNTER — Telehealth: Payer: Self-pay | Admitting: Emergency Medicine

## 2018-06-19 NOTE — Telephone Encounter (Signed)
Pain comes and goes, she has an appointment on Monday with PCP.

## 2020-08-04 ENCOUNTER — Telehealth: Payer: Self-pay

## 2020-08-04 NOTE — Telephone Encounter (Signed)
Bonnie Chen came by the office Friday afternoon demanding a letter from our office. She states it needs to read " I have never had images of my neck". Please advise.

## 2020-08-05 NOTE — Telephone Encounter (Signed)
I haven't seen this patient in 5 years nor have I managed anything in her neck/shoulder.  She also has had imaging of her neck last year.  Do you think she meant to talk to her rheumatologist?  I'm confused regarding my role here.

## 2020-08-08 NOTE — Telephone Encounter (Signed)
I called patient back and advised of Dr Landry Corporal message. She is very upset and was yelling on the phone. She wanted to speak with the office manager. I transferred the call to Lacretia Nicks, office manager.

## 2020-08-09 NOTE — Telephone Encounter (Signed)
I called and spoke with patient.  Patient only needed a letter stating she had not had a cervical imaging study performed here at [place] at [facility].  I spoke with Dr. Dianah Field and he was OK with me typing up the letter and sending it to her.  The letter is below.  I emailed the letter to her at [email or web address].       August 09, 2020  To Whom It May Concern,  Bonnie Chen, DOB: 12/24/2065 has not been treated for a cervical imaging study here at [place] at [facility].  If you have any further questions, please reach out to me directly at [phone].  Thanks so much!!   Sincerely,    Gypsy Lore

## 2021-10-04 ENCOUNTER — Other Ambulatory Visit: Payer: Self-pay

## 2021-10-04 ENCOUNTER — Emergency Department (INDEPENDENT_AMBULATORY_CARE_PROVIDER_SITE_OTHER)
Admission: EM | Admit: 2021-10-04 | Discharge: 2021-10-04 | Disposition: A | Discharge status: Home / Self Care | Payer: BLUE CROSS/BLUE SHIELD | Source: Home / Self Care | Attending: Family Medicine | Admitting: Family Medicine

## 2021-10-04 ENCOUNTER — Emergency Department (INDEPENDENT_AMBULATORY_CARE_PROVIDER_SITE_OTHER): Payer: BLUE CROSS/BLUE SHIELD

## 2021-10-04 DIAGNOSIS — S92514A Nondisplaced fracture of proximal phalanx of right lesser toe(s), initial encounter for closed fracture: Secondary | ICD-10-CM

## 2021-10-04 DIAGNOSIS — G8929 Other chronic pain: Secondary | ICD-10-CM | POA: Diagnosis not present

## 2021-10-04 DIAGNOSIS — M79671 Pain in right foot: Secondary | ICD-10-CM

## 2021-10-04 NOTE — Discharge Instructions (Signed)
Keep toes buddy taped while you are up and walking Wear the fracture boot for an additional 2 weeks Follow-up with your usual pain provider

## 2021-10-04 NOTE — ED Provider Notes (Signed)
[facility]    CSN: [id number] Arrival date & time: 10/04/21  1351      History   Chief Complaint Chief Complaint  Patient presents with   Pain    chronic    HPI Bonnie Chen is a 55 y.o. female.   HPI  Patient has a chronic pain syndrome.  She also has anxiety and depression.  She states that she fell 6 weeks ago.  She states that her whole body hurts more because of this but she is mostly worried because her little toe on her right foot continues to be painful.  She is worried that it may be broken.  Past Medical History:  Diagnosis Date   Anemia    Anxiety    panic disorder   Asthma    Headache(784.0)    Heart murmur    PONV (postoperative nausea and vomiting)    Uterine fibroids affecting pregnancy     Patient Active Problem List   Diagnosis Date Noted   Plantar fasciitis, bilateral 05/15/2015   Obesity 05/15/2015   Left Patellofemoral pain syndrome 09/17/2012   Low back pain 09/17/2012    Past Surgical History:  Procedure Laterality Date   ABDOMINAL HYSTERECTOMY  06/08/2012   Procedure: HYSTERECTOMY ABDOMINAL;  Surgeon: Luz Lex, MD;  Location: [facility];  Service: Gynecology;  Laterality: N/A;   ABDOMINAL WOUND DEHISCENCE  06/15/2012   Procedure: ABDOMINAL WOUND DEHISCENCE;  Surgeon: Luz Lex, MD;  Location: [facility];  Service: Gynecology;  Laterality: N/A;  Repair of fascial dehiscence   CESAREAN SECTION     x 3   UTERINE FIBROID EMBOLIZATION  2009    OB History     Gravida  7   Para  3   Term  3   Preterm      AB  4   Living  3      SAB  1   IAB  3   Ectopic      Multiple      Live Births               Home Medications    Prior to Admission medications   Medication Sig Start Date End Date Taking? Authorizing Provider  escitalopram (LEXAPRO) 20 MG tablet Take 20 mg by mouth daily.   Yes [provider]  gabapentin (NEURONTIN) 300 MG capsule Take 300 mg by mouth 3 (three) times daily.   Yes  [provider]  tizanidine (ZANAFLEX) 6 MG capsule Take 6 mg by mouth 3 (three) times daily.   Yes [provider]  acetaminophen (TYLENOL) 650 MG CR tablet Take 2 tablets (1,300 mg total) by mouth every 8 (eight) hours as needed for pain. 09/17/12   Silverio Decamp, MD  albuterol (PROVENTIL HFA;VENTOLIN HFA) 108 (90 BASE) MCG/ACT inhaler Inhale 2 puffs into the lungs every 6 (six) hours as needed. For asthma Patient not taking: Reported on 10/04/2021    [provider]  cetirizine (ZYRTEC) 5 MG tablet Take 5 mg by mouth daily. Patient not taking: Reported on 10/04/2021    [provider]  cholecalciferol (VITAMIN D) 1000 UNITS tablet Take 1,000 Units by mouth daily.    [provider]  colchicine 0.6 MG tablet Take 1 tablet (0.6 mg total) by mouth daily. Patient not taking: Reported on 10/04/2021 12/15/17   Gregor Hams, MD  diclofenac sodium (VOLTAREN) 1 % GEL Apply 4 g topically 4 (four) times daily. To affected joint.  12/15/17   Gregor Hams, MD  ferrous sulfate 325 (65 FE) MG tablet Take 325 mg by mouth daily with breakfast. Patient not taking: Reported on 10/04/2021    [provider]  hydrochlorothiazide (HYDRODIURIL) 25 MG tablet Take 25 mg by mouth daily. Patient not taking: Reported on 10/04/2021    [provider]  ibuprofen (ADVIL,MOTRIN) 800 MG tablet Take 1 tablet (800 mg total) by mouth every 6 (six) hours as needed for pain. 09/16/12   Deneise Lever, MD  losartan (COZAAR) 100 MG tablet Take 100 mg by mouth daily.    [provider]  Multiple Vitamins-Minerals (MULTIVITAMIN WITH MINERALS) tablet Take 1 tablet by mouth daily.    [provider]    Family History Family History  Problem Relation Age of Onset   Hyperlipidemia Mother    Stroke Father    Heart attack Father    Cancer Father        lung   Diabetes Father    Other Neg Hx     Social History Social History   Tobacco Use    Smoking status: Former    Packs/day: 0.50    Types: Cigarettes    Quit date: 06/08/2012    Years since quitting: 9.3   Smokeless tobacco: Never  Substance Use Topics   Alcohol use: No   Drug use: No     Allergies   Ciprocinonide [fluocinolone]   Review of Systems Review of Systems See HPI  Physical Exam Triage Vital Signs ED Triage Vitals  Enc Vitals Group     BP 10/04/21 1422 (!) 161/98     Pulse Rate 10/04/21 1422 (!) 58     Resp 10/04/21 1422 14     Temp 10/04/21 1422 99 F (37.2 C)     Temp Source 10/04/21 1422 Oral     SpO2 10/04/21 1422 97 %     Weight --      Height --      Head Circumference --      Peak Flow --      Pain Score 10/04/21 1424 10     Pain Loc --      Pain Edu? --      Excl. in [place]? --    No data found.  Updated Vital Signs BP (!) 161/98 (BP Location: Left Arm)   Pulse (!) 58   Temp 99 F (37.2 C) (Oral)   Resp 14   LMP 05/11/2012   SpO2 97%      Physical Exam Constitutional:      General: She is in acute distress.     Appearance: She is well-developed. She is obese. She is ill-appearing.     Comments: Very stiff posture.  Very slow movements.  Frowns.  Appears uncomfortable  HENT:     Head: Normocephalic and atraumatic.     Mouth/Throat:     Comments: Mask is in place Eyes:     Conjunctiva/sclera: Conjunctivae normal.     Pupils: Pupils are equal, round, and reactive to light.  Cardiovascular:     Rate and Rhythm: Normal rate.  Pulmonary:     Effort: Pulmonary effort is normal. No respiratory distress.  Abdominal:     General: There is no distension.     Palpations: Abdomen is soft.  Musculoskeletal:        General: Normal range of motion.     Cervical back: Normal range of motion.     Comments: Right foot is  examined.  It appears normal there is tenderness palpation of the fifth toe and with any movement of the toe.  There is no deformity.  Skin:    General: Skin is warm and dry.  Neurological:     Mental Status: She  is alert.     Gait: Gait abnormal.     UC Treatments / Results  Labs (all labs ordered are listed, but only abnormal results are displayed) Labs Reviewed - No data to display  EKG   Radiology DG Foot Complete Right  Result Date: 10/04/2021 CLINICAL DATA:  Chronic right foot pain EXAM: RIGHT FOOT COMPLETE - 3+ VIEW COMPARISON:  None. FINDINGS: Acute appearing nondisplaced fracture identified at the proximal aspect of the fifth proximal phalanx. No additional fracture identified. Hallux valgus deformity with mild degenerative changes at the head of the first metatarsal. Small plantar calcaneal spur. IMPRESSION: Acute appearing nondisplaced fracture of the fifth proximal phalanx. Electronically Signed   By: Ofilia Neas M.D.   On: 10/04/2021 14:56    Procedures Procedures (including critical care time)  Medications Ordered in UC Medications - No data to display  Initial Impression / Assessment and Plan / UC Course  I have reviewed the triage vital signs and the nursing notes.  Pertinent labs & imaging results that were available during my care of the patient were reviewed by me and considered in my medical decision making (see chart for details).     She has a nondisplaced fracture return.  73 weeks old.  Early callus formation, not very much.  I did mobilize the toe and asked her to follow-up with her usual pain provider Final Clinical Impressions(s) / UC Diagnoses   Final diagnoses:  Nondisplaced fracture of proximal phalanx of right lesser toe(s), initial encounter for closed fracture     Discharge Instructions      Keep toes buddy taped while you are up and walking Wear the fracture boot for an additional 2 weeks Follow-up with your usual pain provider     ED Prescriptions   None    I have reviewed the PDMP during this encounter.   Raylene Everts, MD 10/04/21 2046

## 2021-10-04 NOTE — ED Triage Notes (Signed)
Pt presents with chronic pain in her arm, leg, neck and back that is increased since a fall on her right side. Pts fall was one month and a half ago. Pt states she also believes she broke her pinky toe on her rt toe. Pt requesting an xray.

## 2021-10-31 ENCOUNTER — Emergency Department (INDEPENDENT_AMBULATORY_CARE_PROVIDER_SITE_OTHER): Payer: BLUE CROSS/BLUE SHIELD

## 2021-10-31 ENCOUNTER — Other Ambulatory Visit: Payer: Self-pay

## 2021-10-31 ENCOUNTER — Emergency Department
Admission: EM | Admit: 2021-10-31 | Discharge: 2021-10-31 | Disposition: A | Discharge status: Home / Self Care | Payer: BLUE CROSS/BLUE SHIELD | Source: Home / Self Care | Attending: Family Medicine | Admitting: Family Medicine

## 2021-10-31 DIAGNOSIS — S92504A Nondisplaced unspecified fracture of right lesser toe(s), initial encounter for closed fracture: Secondary | ICD-10-CM | POA: Diagnosis not present

## 2021-10-31 DIAGNOSIS — M79671 Pain in right foot: Secondary | ICD-10-CM

## 2021-10-31 NOTE — Discharge Instructions (Addendum)
There is very little evidence of healing on your x-ray You need to see orthopedics or podiatry in follow-up The fracture shoe will give you more support

## 2021-10-31 NOTE — ED Triage Notes (Signed)
Pt states that she fell a few months ago and her whole right side still hurts. Pt states that she has some foot pain that really hurts.

## 2021-10-31 NOTE — ED Provider Notes (Signed)
[facility]    CSN: [id number] Arrival date & time: 10/31/21  1151      History   Chief Complaint Chief Complaint  Patient presents with   Foot Pain    Pt states that she still has some chronic, right foot pain.     HPI Bonnie Chen is a 55 y.o. female.   HPI  As well as patient about a month ago.  She had a fall to the right several weeks earlier.  Identified that she had a nonhealed fractured toe.  I placed her into a fracture shoe and told her to wear this until her pain had improved.  She is back today stating she still has "severe" pain in her toe.  She is not wearing the fracture shoe.  Past Medical History:  Diagnosis Date   Anemia    Anxiety    panic disorder   Asthma    Headache(784.0)    Heart murmur    PONV (postoperative nausea and vomiting)    Uterine fibroids affecting pregnancy     Patient Active Problem List   Diagnosis Date Noted   Plantar fasciitis, bilateral 05/15/2015   Obesity 05/15/2015   Left Patellofemoral pain syndrome 09/17/2012   Low back pain 09/17/2012    Past Surgical History:  Procedure Laterality Date   ABDOMINAL HYSTERECTOMY  06/08/2012   Procedure: HYSTERECTOMY ABDOMINAL;  Surgeon: Luz Lex, MD;  Location: [facility];  Service: Gynecology;  Laterality: N/A;   ABDOMINAL WOUND DEHISCENCE  06/15/2012   Procedure: ABDOMINAL WOUND DEHISCENCE;  Surgeon: Luz Lex, MD;  Location: [facility];  Service: Gynecology;  Laterality: N/A;  Repair of fascial dehiscence   CESAREAN SECTION     x 3   UTERINE FIBROID EMBOLIZATION  2009    OB History     Gravida  7   Para  3   Term  3   Preterm      AB  4   Living  3      SAB  1   IAB  3   Ectopic      Multiple      Live Births               Home Medications    Prior to Admission medications   Medication Sig Start Date End Date Taking? Authorizing Provider  acetaminophen (TYLENOL) 650 MG CR tablet Take 2 tablets (1,300 mg total) by mouth every 8  (eight) hours as needed for pain. 09/17/12  Yes Silverio Decamp, MD  albuterol (PROVENTIL HFA;VENTOLIN HFA) 108 (90 BASE) MCG/ACT inhaler Inhale 2 puffs into the lungs every 6 (six) hours as needed. For asthma   Yes [provider]  cetirizine (ZYRTEC) 5 MG tablet Take 5 mg by mouth daily.   Yes [provider]  cholecalciferol (VITAMIN D) 1000 UNITS tablet Take 1,000 Units by mouth daily.   Yes [provider]  colchicine 0.6 MG tablet Take 1 tablet (0.6 mg total) by mouth daily. 12/15/17  Yes Gregor Hams, MD  diclofenac sodium (VOLTAREN) 1 % GEL Apply 4 g topically 4 (four) times daily. To affected joint. 12/15/17  Yes Gregor Hams, MD  escitalopram (LEXAPRO) 20 MG tablet Take 20 mg by mouth daily.   Yes [provider]  ferrous sulfate 325 (65 FE) MG tablet Take 325 mg by mouth daily with breakfast.   Yes [provider]  gabapentin (NEURONTIN) 300 MG capsule Take 300 mg by  mouth 3 (three) times daily.   Yes [provider]  hydrochlorothiazide (HYDRODIURIL) 25 MG tablet Take 25 mg by mouth daily.   Yes [provider]  ibuprofen (ADVIL,MOTRIN) 800 MG tablet Take 1 tablet (800 mg total) by mouth every 6 (six) hours as needed for pain. 09/16/12  Yes Deneise Lever, MD  losartan (COZAAR) 100 MG tablet Take 100 mg by mouth daily.   Yes [provider]  Multiple Vitamins-Minerals (MULTIVITAMIN WITH MINERALS) tablet Take 1 tablet by mouth daily.   Yes [provider]  tizanidine (ZANAFLEX) 6 MG capsule Take 6 mg by mouth 3 (three) times daily.   Yes [provider]    Family History Family History  Problem Relation Age of Onset   Hyperlipidemia Mother    Stroke Father    Heart attack Father    Cancer Father        lung   Diabetes Father    Other Neg Hx     Social History Social History   Tobacco Use   Smoking status: Former    Packs/day: 0.50    Types: Cigarettes    Quit date: 06/08/2012     Years since quitting: 9.4   Smokeless tobacco: Never  Substance Use Topics   Alcohol use: No   Drug use: No     Allergies   Ciprofloxacin, Ciprocinonide [fluocinolone], and Pedi-pre tape spray [wound dressing adhesive]   Review of Systems Review of Systems See HPI  Physical Exam Triage Vital Signs ED Triage Vitals  Enc Vitals Group     BP 10/31/21 1241 (!) 162/90     Pulse Rate 10/31/21 1241 62     Resp 10/31/21 1241 18     Temp 10/31/21 1241 98.5 F (36.9 C)     Temp Source 10/31/21 1241 Oral     SpO2 10/31/21 1241 98 %     Weight 10/31/21 1235 190 lb (86.2 kg)     Height 10/31/21 1235 5\' 3"  (1.6 m)     Head Circumference --      Peak Flow --      Pain Score 10/31/21 1234 7     Pain Loc --      Pain Edu? --      Excl. in [place]? --    No data found.  Updated Vital Signs BP (!) 162/90 (BP Location: Left Arm)   Pulse 62   Temp 98.5 F (36.9 C) (Oral)   Resp 18   Ht 5\' 3"  (1.6 m)   Wt 86.2 kg   LMP 05/11/2012   SpO2 98%   BMI 33.66 kg/m        Physical Exam   UC Treatments / Results  Labs (all labs ordered are listed, but only abnormal results are displayed) Labs Reviewed - No data to display  EKG   Radiology DG Foot Complete Right  Result Date: 10/31/2021 CLINICAL DATA:  Lateral foot pain, fracture of the small toe EXAM: RIGHT FOOT COMPLETE - 3+ VIEW COMPARISON:  10/04/2021 FINDINGS: Oblique fracture of the proximal phalanx small toe extends from the medial metadiaphysis back towards the lateral base of the toe although the proximal extent of the fracture is poorly characterized on this and prior exams. No substantial change from the 10/04/2021 appearance Mild bony bunion of the first metatarsal. No malalignment at the Lisfranc joint. No new fracture identified. Small plantar calcaneal spur. IMPRESSION: 1. Oblique fracture in the proximal phalanx small toe not substantially changed compared to  10/04/2021, with the fracture plane still visible.  Electronically Signed   By: Van Clines M.D.   On: 10/31/2021 13:57    Procedures Procedures (including critical care time)  Medications Ordered in UC Medications - No data to display  Initial Impression / Assessment and Plan / UC Course  I have reviewed the triage vital signs and the nursing notes.  Pertinent labs & imaging results that were available during my care of the patient were reviewed by me and considered in my medical decision making (see chart for details).     I explained to the patient that her fracture was not yet healed.  There is some early callus formation but the fracture line is still quite visible.  This fracture is now 10 weeks or more old.  She needs to see a podiatrist or orthopedic for follow-up. Final Clinical Impressions(s) / UC Diagnoses   Final diagnoses:  Foot pain, right     Discharge Instructions      There is very little evidence of healing on your x-ray You need to see orthopedics or podiatry in follow-up The fracture shoe will give you more support    ED Prescriptions   None    I have reviewed the PDMP during this encounter.   Raylene Everts, MD 10/31/21 2013

## 2021-11-06 ENCOUNTER — Telehealth: Payer: Self-pay | Admitting: Emergency Medicine

## 2021-11-06 NOTE — Telephone Encounter (Signed)
Pt called to request a photocopy of her foot xray from her last visit. Dr Meda Coffee here today - okay to provide pt w/ a copy of xray- copy to be left at front desk. Pt to bring ID with her to pick up copy

## 2021-11-07 ENCOUNTER — Emergency Department
Admission: EM | Admit: 2021-11-07 | Discharge: 2021-11-07 | Disposition: A | Discharge status: Home / Self Care | Payer: BLUE CROSS/BLUE SHIELD | Source: Home / Self Care

## 2021-11-07 ENCOUNTER — Other Ambulatory Visit: Payer: Self-pay

## 2021-11-07 ENCOUNTER — Emergency Department (INDEPENDENT_AMBULATORY_CARE_PROVIDER_SITE_OTHER): Payer: BLUE CROSS/BLUE SHIELD

## 2021-11-07 DIAGNOSIS — J069 Acute upper respiratory infection, unspecified: Secondary | ICD-10-CM | POA: Diagnosis not present

## 2021-11-07 DIAGNOSIS — K5732 Diverticulitis of large intestine without perforation or abscess without bleeding: Secondary | ICD-10-CM

## 2021-11-07 DIAGNOSIS — R0789 Other chest pain: Secondary | ICD-10-CM

## 2021-11-07 LAB — POCT INFLUENZA A/B
Influenza A, POC: NEGATIVE
Influenza B, POC: NEGATIVE

## 2021-11-07 LAB — POC SARS CORONAVIRUS 2 AG -  ED: SARS Coronavirus 2 Ag: NEGATIVE

## 2021-11-07 MED ORDER — AMOXICILLIN-POT CLAVULANATE 875-125 MG PO TABS: 1.0000 | ORAL_TABLET | Freq: Two times a day (BID) | ORAL | 0 refills | Status: AC

## 2021-11-07 NOTE — Discharge Instructions (Signed)
Negative flu and COVID tests. No abnormality noted on chest x-ray.  Symptoms consistent with viral URI and diverticulitis flare. Take antibiotics as prescribed. Take OTC medicine as needed for cough and discomfort - you declined prescription cough medication. Use inhaler as needed. Follow-up with PCP if minimal improvement in a week. Go to the ER if develop difficulty breathing not improved with the inhaler, severe/worsening abdominal pain, or other new concerning symptoms.

## 2021-11-07 NOTE — ED Provider Notes (Signed)
[name]    CSN: [id number] Arrival date & time: 11/07/21  1632      History   Chief Complaint Chief Complaint  Patient presents with   Abdominal Pain   Generalized Body Aches   Nausea   Shortness of Breath    HPI Bonnie Chen is a 55 y.o. female.   Patient presents with concerns of feeling unwell for the past few days. She reports it started with abdominal discomfort and diarrhea which she initially attributed to a diverticulitis flare or her IBS. She has since developed headache, body aches including upper chest and back pain, congestion, cough, and continued abdominal discomfort and nausea. The patient reports her chest is tender and states she has fibromyalgia which sometimes causes similar tenderness but she is concerned given her other symptoms that she has something. She denies known fever. She is concerned she could have COVID or pneumonia as she states her O2 at home went down to 94%. The patient has not taken anything for her symptoms. She denies known sick contacts. She does have history of asthma and reports some mild chest tightness and feeling like she has to work harder to get a deep breath but has not used her inhaler.   The history is provided by the patient.  Abdominal Pain Associated symptoms: chest pain, cough, diarrhea, fatigue, nausea and shortness of breath   Associated symptoms: no fever, no sore throat and no vomiting   Shortness of Breath Associated symptoms: abdominal pain, chest pain, cough and headaches   Associated symptoms: no fever, no rash, no sore throat and no vomiting    Past Medical History:  Diagnosis Date   Anemia    Anxiety    panic disorder   Asthma    Headache(784.0)    Heart murmur    PONV (postoperative nausea and vomiting)    Uterine fibroids affecting pregnancy     Patient Active Problem List   Diagnosis Date Noted   Plantar fasciitis, bilateral 05/15/2015   Obesity 05/15/2015   Left Patellofemoral pain  syndrome 09/17/2012   Low back pain 09/17/2012    Past Surgical History:  Procedure Laterality Date   ABDOMINAL HYSTERECTOMY  06/08/2012   Procedure: HYSTERECTOMY ABDOMINAL;  Surgeon: Luz Lex, MD;  Location: [facility];  Service: Gynecology;  Laterality: N/A;   ABDOMINAL WOUND DEHISCENCE  06/15/2012   Procedure: ABDOMINAL WOUND DEHISCENCE;  Surgeon: Luz Lex, MD;  Location: [facility];  Service: Gynecology;  Laterality: N/A;  Repair of fascial dehiscence   CESAREAN SECTION     x 3   UTERINE FIBROID EMBOLIZATION  2009    OB History     Gravida  7   Para  3   Term  3   Preterm      AB  4   Living  3      SAB  1   IAB  3   Ectopic      Multiple      Live Births               Home Medications    Prior to Admission medications   Medication Sig Start Date End Date Taking? Authorizing Provider  acetaminophen (TYLENOL) 650 MG CR tablet Take 2 tablets (1,300 mg total) by mouth every 8 (eight) hours as needed for pain. 09/17/12   Silverio Decamp, MD  albuterol (PROVENTIL HFA;VENTOLIN HFA) 108 (90 BASE) MCG/ACT inhaler Inhale 2 puffs into the lungs every 6 (six)  hours as needed. For asthma    [provider]  amoxicillin-clavulanate (AUGMENTIN) 875-125 MG tablet Take 1 tablet by mouth every 12 (twelve) hours for 10 days. 11/07/21 11/17/21 Yes Emmabelle Fear L, PA  cetirizine (ZYRTEC) 5 MG tablet Take 5 mg by mouth daily.    [provider]  cholecalciferol (VITAMIN D) 1000 UNITS tablet Take 1,000 Units by mouth daily.    [provider]  colchicine 0.6 MG tablet Take 1 tablet (0.6 mg total) by mouth daily. 12/15/17   Gregor Hams, MD  diclofenac sodium (VOLTAREN) 1 % GEL Apply 4 g topically 4 (four) times daily. To affected joint. 12/15/17   Gregor Hams, MD  escitalopram (LEXAPRO) 20 MG tablet Take 20 mg by mouth daily.    [provider]  ferrous sulfate 325 (65 FE) MG tablet Take 325 mg by mouth daily with breakfast.    [provider]  gabapentin (NEURONTIN) 300 MG capsule Take 300 mg by mouth 3 (three) times daily.    [provider]  hydrochlorothiazide (HYDRODIURIL) 25 MG tablet Take 25 mg by mouth daily.    [provider]  ibuprofen (ADVIL,MOTRIN) 800 MG tablet Take 1 tablet (800 mg total) by mouth every 6 (six) hours as needed for pain. 09/16/12   Deneise Lever, MD  losartan (COZAAR) 100 MG tablet Take 100 mg by mouth daily.    [provider]  Multiple Vitamins-Minerals (MULTIVITAMIN WITH MINERALS) tablet Take 1 tablet by mouth daily.    [provider]  tizanidine (ZANAFLEX) 6 MG capsule Take 6 mg by mouth 3 (three) times daily.    [provider]    Family History Family History  Problem Relation Age of Onset   Hyperlipidemia Mother    Stroke Father    Heart attack Father    Cancer Father        lung   Diabetes Father    Other Neg Hx     Social History Social History   Tobacco Use   Smoking status: Former    Packs/day: 0.50    Types: Cigarettes    Quit date: 06/08/2012    Years since quitting: 9.4   Smokeless tobacco: Never  Vaping Use   Vaping Use: Never used  Substance Use Topics   Alcohol use: No   Drug use: No     Allergies   Ciprofloxacin, Ciprocinonide [fluocinolone], and Pedi-pre tape spray [wound dressing adhesive]   Review of Systems Review of Systems  Constitutional:  Positive for appetite change and fatigue. Negative for fever.  HENT:  Positive for congestion. Negative for sore throat.   Respiratory:  Positive for cough, chest tightness and shortness of breath.   Cardiovascular:  Positive for chest pain.  Gastrointestinal:  Positive for abdominal pain, diarrhea and nausea. Negative for blood in stool and vomiting.  Musculoskeletal:  Positive for back pain and myalgias.  Skin:  Negative for rash.  Neurological:  Positive for headaches. Negative for dizziness.    Physical Exam Triage Vital Signs ED Triage Vitals   Enc Vitals Group     BP 11/07/21 1654 129/89     Pulse Rate 11/07/21 1654 64     Resp 11/07/21 1654 20     Temp 11/07/21 1654 99 F (37.2 C)     Temp Source 11/07/21 1654 Oral     SpO2 11/07/21 1654 98 %     Weight 11/07/21 1653 190 lb (86.2 kg)     Height  11/07/21 1653 5\' 3"  (1.6 m)     Head Circumference --      Peak Flow --      Pain Score 11/07/21 1652 6     Pain Loc --      Pain Edu? --      Excl. in [place]? --    No data found.  Updated Vital Signs BP 129/89 (BP Location: Right Arm)   Pulse 64   Temp 99 F (37.2 C) (Oral)   Resp 20   Ht 5\' 3"  (1.6 m)   Wt 190 lb (86.2 kg)   LMP 05/11/2012   SpO2 98%   BMI 33.66 kg/m   Visual Acuity Right Eye Distance:   Left Eye Distance:   Bilateral Distance:    Right Eye Near:   Left Eye Near:    Bilateral Near:     Physical Exam Vitals and nursing note reviewed.  Constitutional:      General: She is not in acute distress. HENT:     Head: Normocephalic.     Right Ear: Tympanic membrane, ear canal and external ear normal.     Left Ear: Tympanic membrane, ear canal and external ear normal.     Nose: Nose normal.     Mouth/Throat:     Mouth: Mucous membranes are moist.     Pharynx: Oropharynx is clear.  Eyes:     Conjunctiva/sclera: Conjunctivae normal.     Pupils: Pupils are equal, round, and reactive to light.  Cardiovascular:     Rate and Rhythm: Normal rate and regular rhythm.     Heart sounds: Normal heart sounds.  Pulmonary:     Effort: Pulmonary effort is normal.     Breath sounds: Normal breath sounds.  Abdominal:     Palpations: Abdomen is soft.     Tenderness: There is abdominal tenderness in the epigastric area and left lower quadrant. There is no guarding or rebound.  Musculoskeletal:     Cervical back: Normal range of motion.     Comments: Diffuse tenderness to bilateral upper back as well as across upper chest  Lymphadenopathy:     Cervical: No cervical adenopathy.  Skin:    Findings: No rash.   Neurological:     General: No focal deficit present.     Mental Status: She is alert.     UC Treatments / Results  Labs (all labs ordered are listed, but only abnormal results are displayed) Labs Reviewed  POCT INFLUENZA A/B  POC SARS CORONAVIRUS 2 AG -  ED    EKG   Radiology DG Chest 2 View  Result Date: 11/07/2021 CLINICAL DATA:  Chest tightness EXAM: CHEST - 2 VIEW COMPARISON:  06/18/2018 FINDINGS: The heart size and mediastinal contours are within normal limits. Both lungs are clear. The visualized skeletal structures are unremarkable. IMPRESSION: No active cardiopulmonary disease. Electronically Signed   By: Donavan Foil M.D.   On: 11/07/2021 18:09    Procedures Procedures (including critical care time)  Medications Ordered in UC Medications - No data to display  Initial Impression / Assessment and Plan / UC Course  I have reviewed the triage vital signs and the nursing notes.  Pertinent labs & imaging results that were available during my care of the patient were reviewed by me and considered in my medical decision making (see chart for details).     Neg flu and COVID. Suspect viral process, though possible diverticulitis flare causing the abdominal sx. Pt reports she has  severe anxiety and really wanted CXR to rule out pna/other problems causing her cough and chest discomfort - ordered and normal. Rx empiric abx for diverticulitis. Offered cough medication but pt declined. Recommended OTC sx tx and f/u with PCP if no improvement. ER precautions discussed.   E/M: 1 acute complicated illness, 3 data (Covid, flu, CXR), moderate risk due to prescription management   Final Clinical Impressions(s) / UC Diagnoses   Final diagnoses:  Diverticulitis of colon  Viral URI     Discharge Instructions      Negative flu and COVID tests. No abnormality noted on chest x-ray.  Symptoms consistent with viral URI and diverticulitis flare. Take antibiotics as prescribed. Take  OTC medicine as needed for cough and discomfort - you declined prescription cough medication. Use inhaler as needed. Follow-up with PCP if minimal improvement in a week. Go to the ER if develop difficulty breathing not improved with the inhaler, severe/worsening abdominal pain, or other new concerning symptoms.      ED Prescriptions     Medication Sig Dispense Auth. Provider   amoxicillin-clavulanate (AUGMENTIN) 875-125 MG tablet Take 1 tablet by mouth every 12 (twelve) hours for 10 days. 20 tablet Abner Greenspan, Keari Miu L, PA      PDMP not reviewed this encounter.   Delsa Sale, [place] 11/07/21 1816

## 2021-11-07 NOTE — ED Triage Notes (Signed)
Pt presents to Urgent Care with c/o abdominal pain w/ nausea, generalized body aches (including HA and cp), and sob x 2-3 days. Afebrile. Has not done a COVID test.

## 2021-11-14 ENCOUNTER — Telehealth: Payer: Self-pay

## 2021-11-14 NOTE — Telephone Encounter (Signed)
Pt called regarding appt 10/04/2021. Pt states that she wanted a copy of the order that she was given for her boot.  I looked back on that date and couldn't find where it was ordered in the computer. Pt states that she did sign something and wanted a copy of it. I told patient that I would send a phone note regarding her request and she was given the donjoi contact information as well. LM

## 2023-06-16 IMAGING — DX DG FOOT COMPLETE 3+V*R*
3 series · 3 of 3 positions shown · non-contrast
Comparison: 10/04/2021

CLINICAL DATA: Lateral foot pain, fracture of the small toe

EXAM:
RIGHT FOOT COMPLETE - 3+ VIEW

[foot ap]
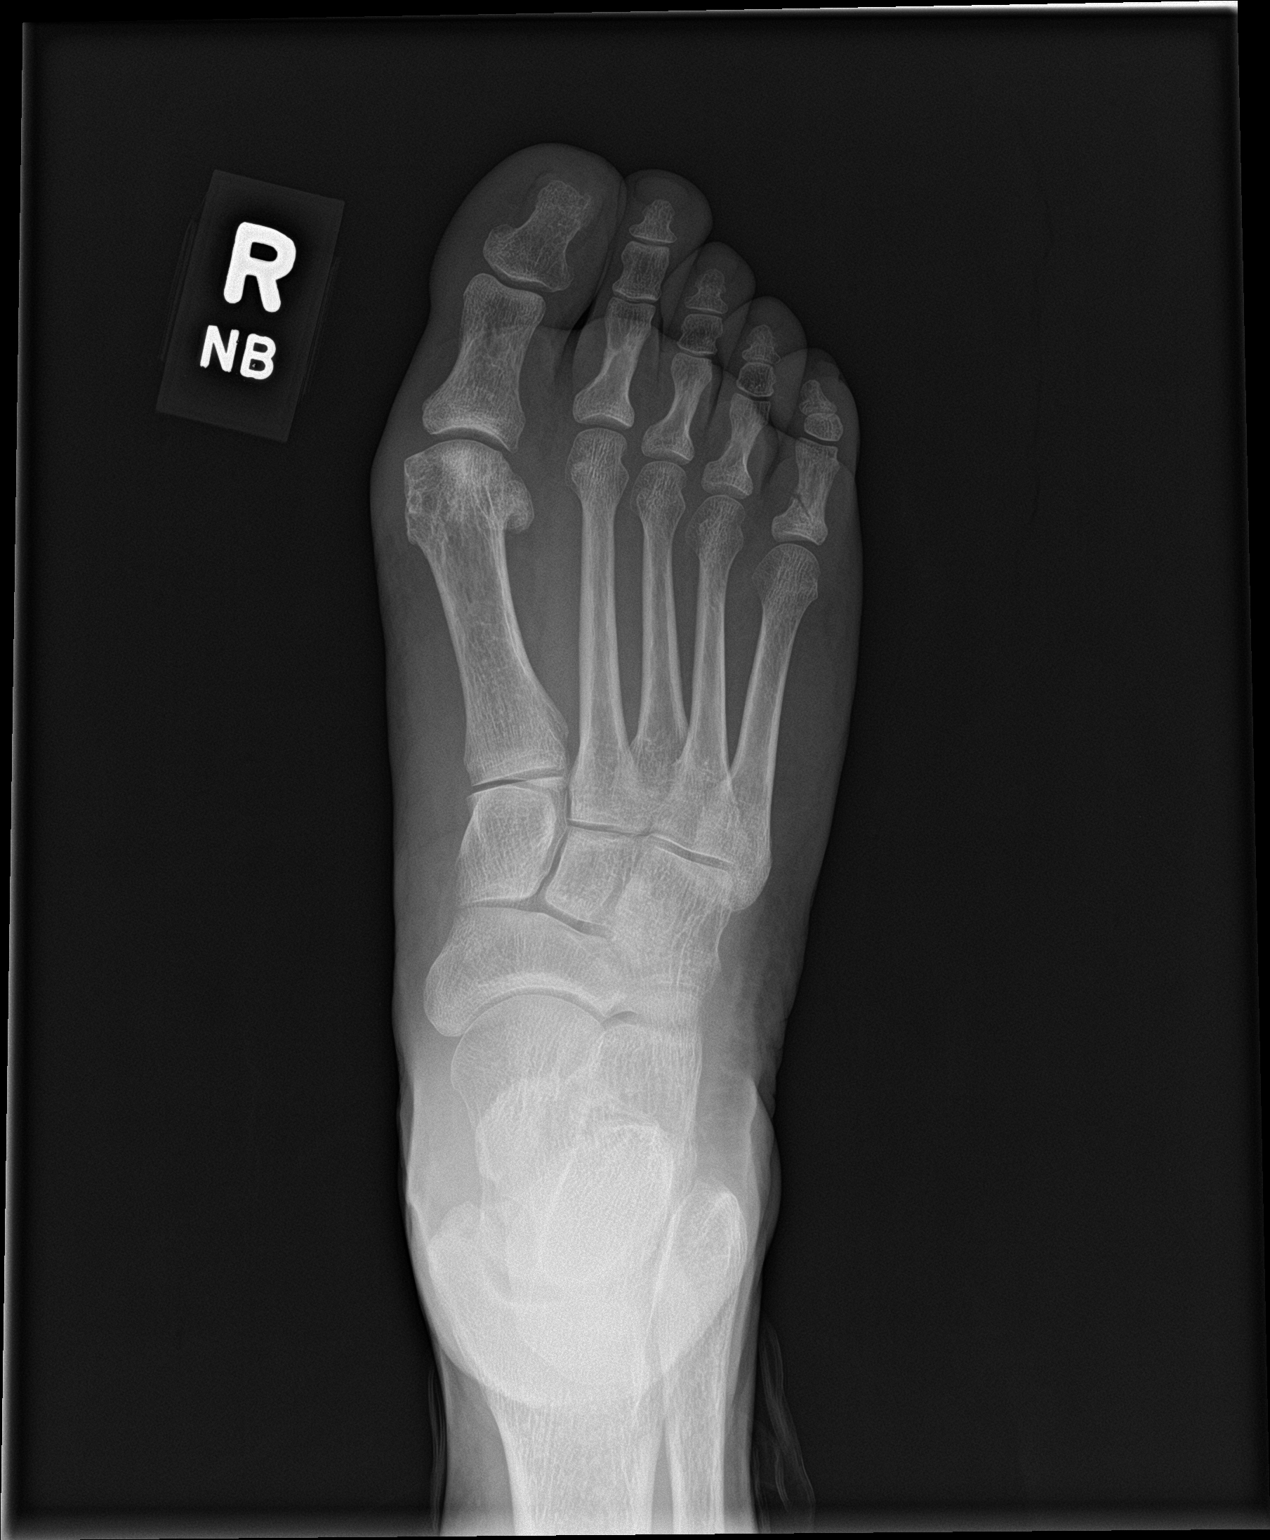

[foot obl]
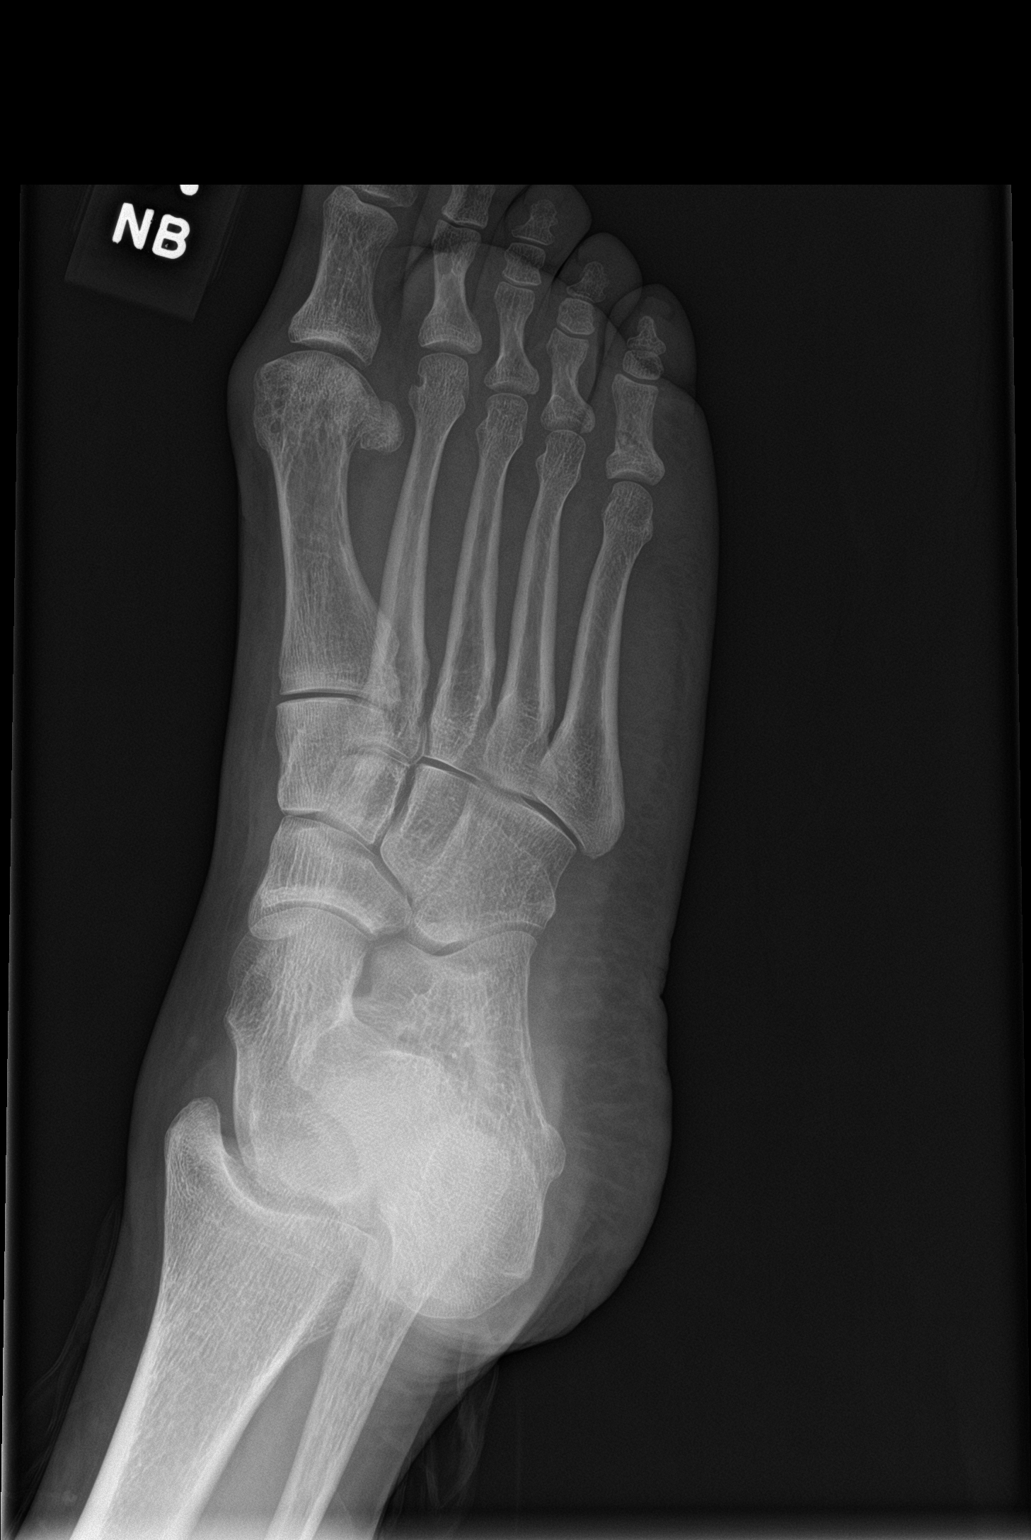

[foot lat]
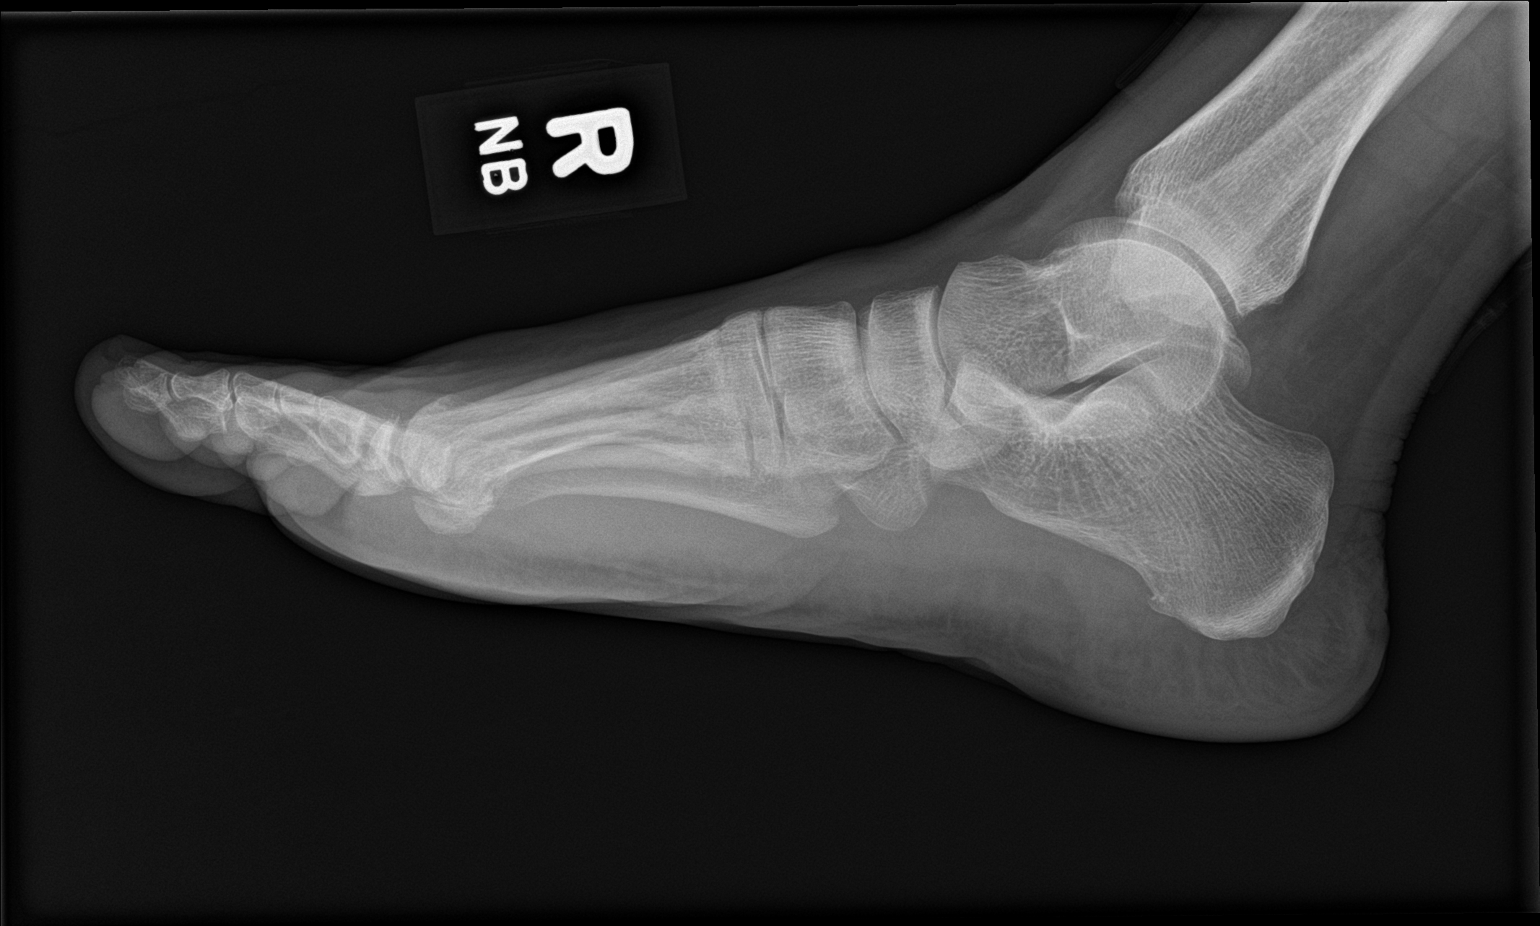

[3 of 3 positions shown; findings below may reference images not displayed]

FINDINGS: Oblique fracture of the proximal phalanx small toe extends from the
medial metadiaphysis back towards the lateral base of the toe
although the proximal extent of the fracture is poorly characterized
on this and prior exams. No substantial change from the 10/04/2021
appearance

Mild bony bunion of the first metatarsal. No malalignment at the
Lisfranc joint. No new fracture identified.

Small plantar calcaneal spur.
IMPRESSION: 1. Oblique fracture in the proximal phalanx small toe not
substantially changed compared to 10/04/2021, with the fracture
plane still visible.

## 2023-06-23 IMAGING — DX DG CHEST 2V
2 series · 2 of 2 positions shown · non-contrast
Comparison: 06/18/2018

CLINICAL DATA: Chest tightness

EXAM:
CHEST - 2 VIEW

[chest pa]
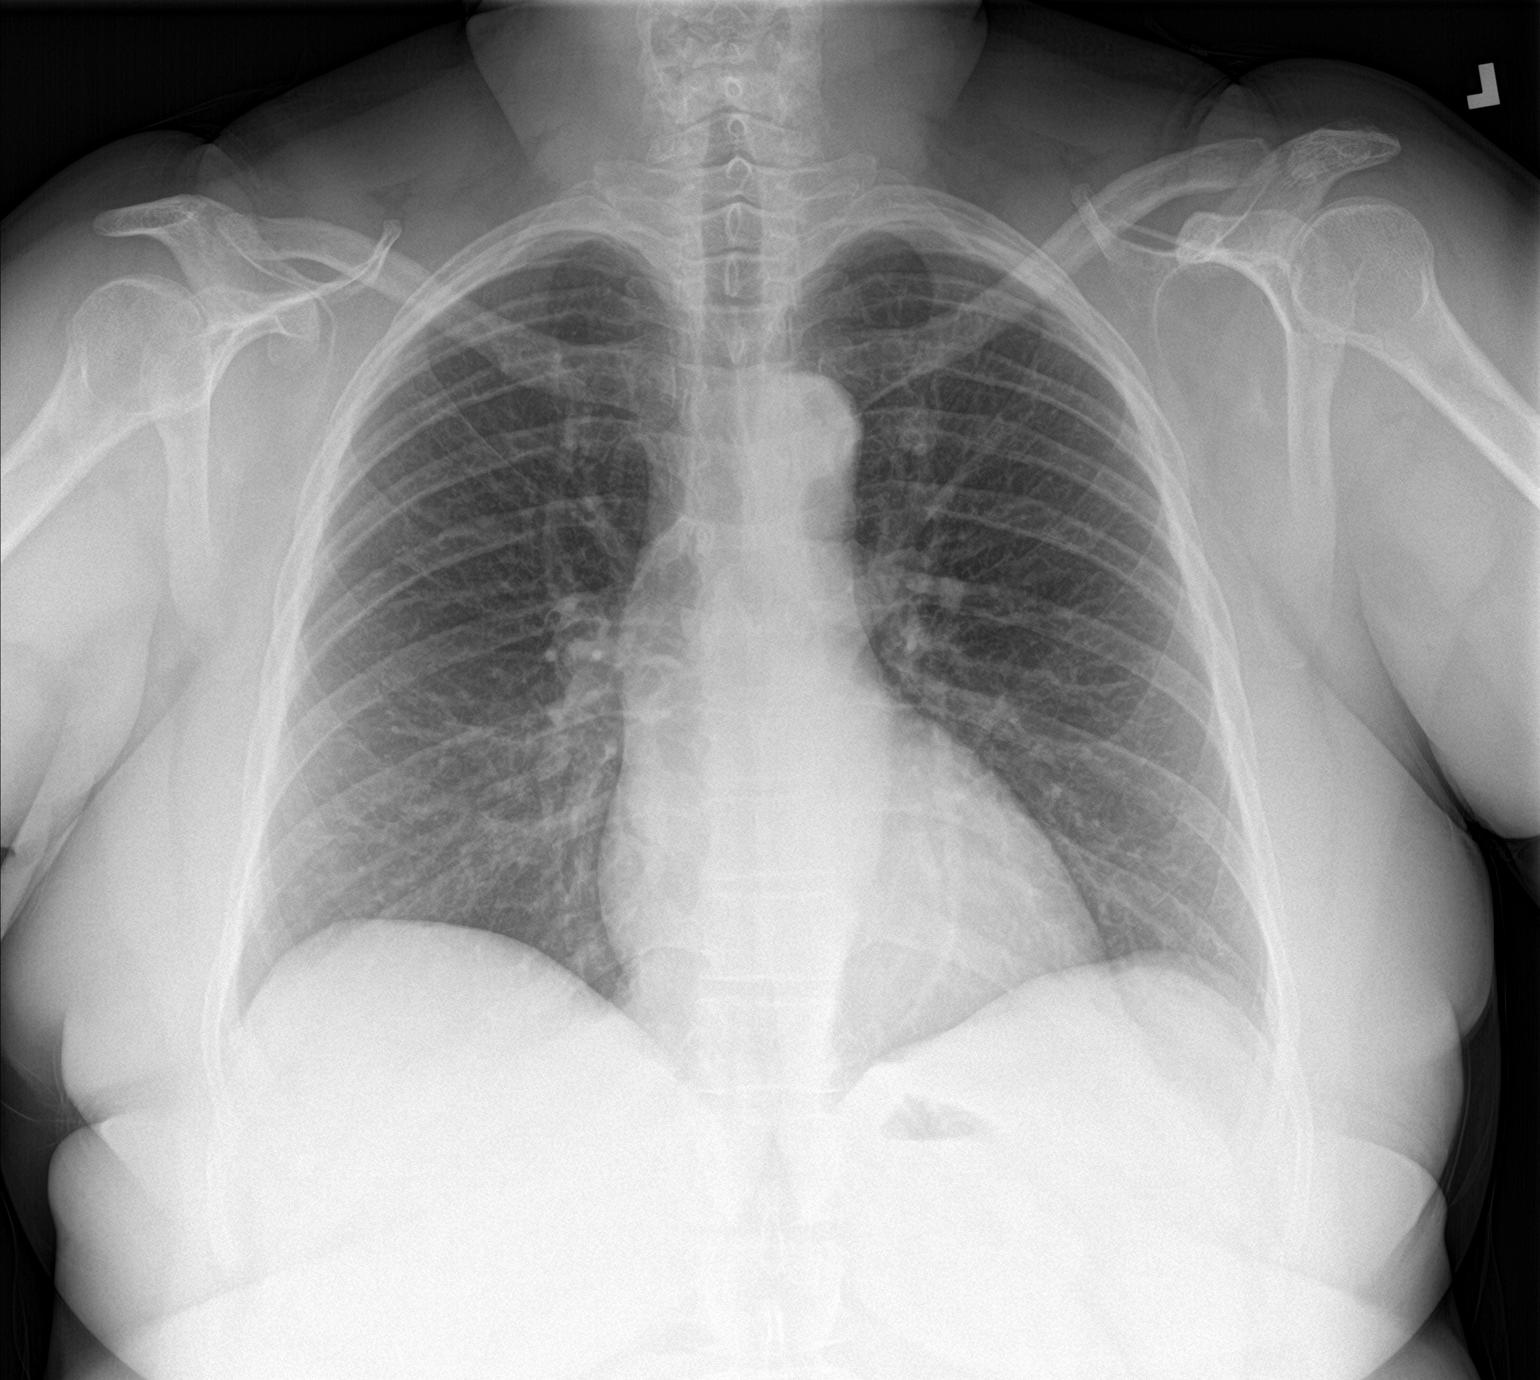

[chest lat]
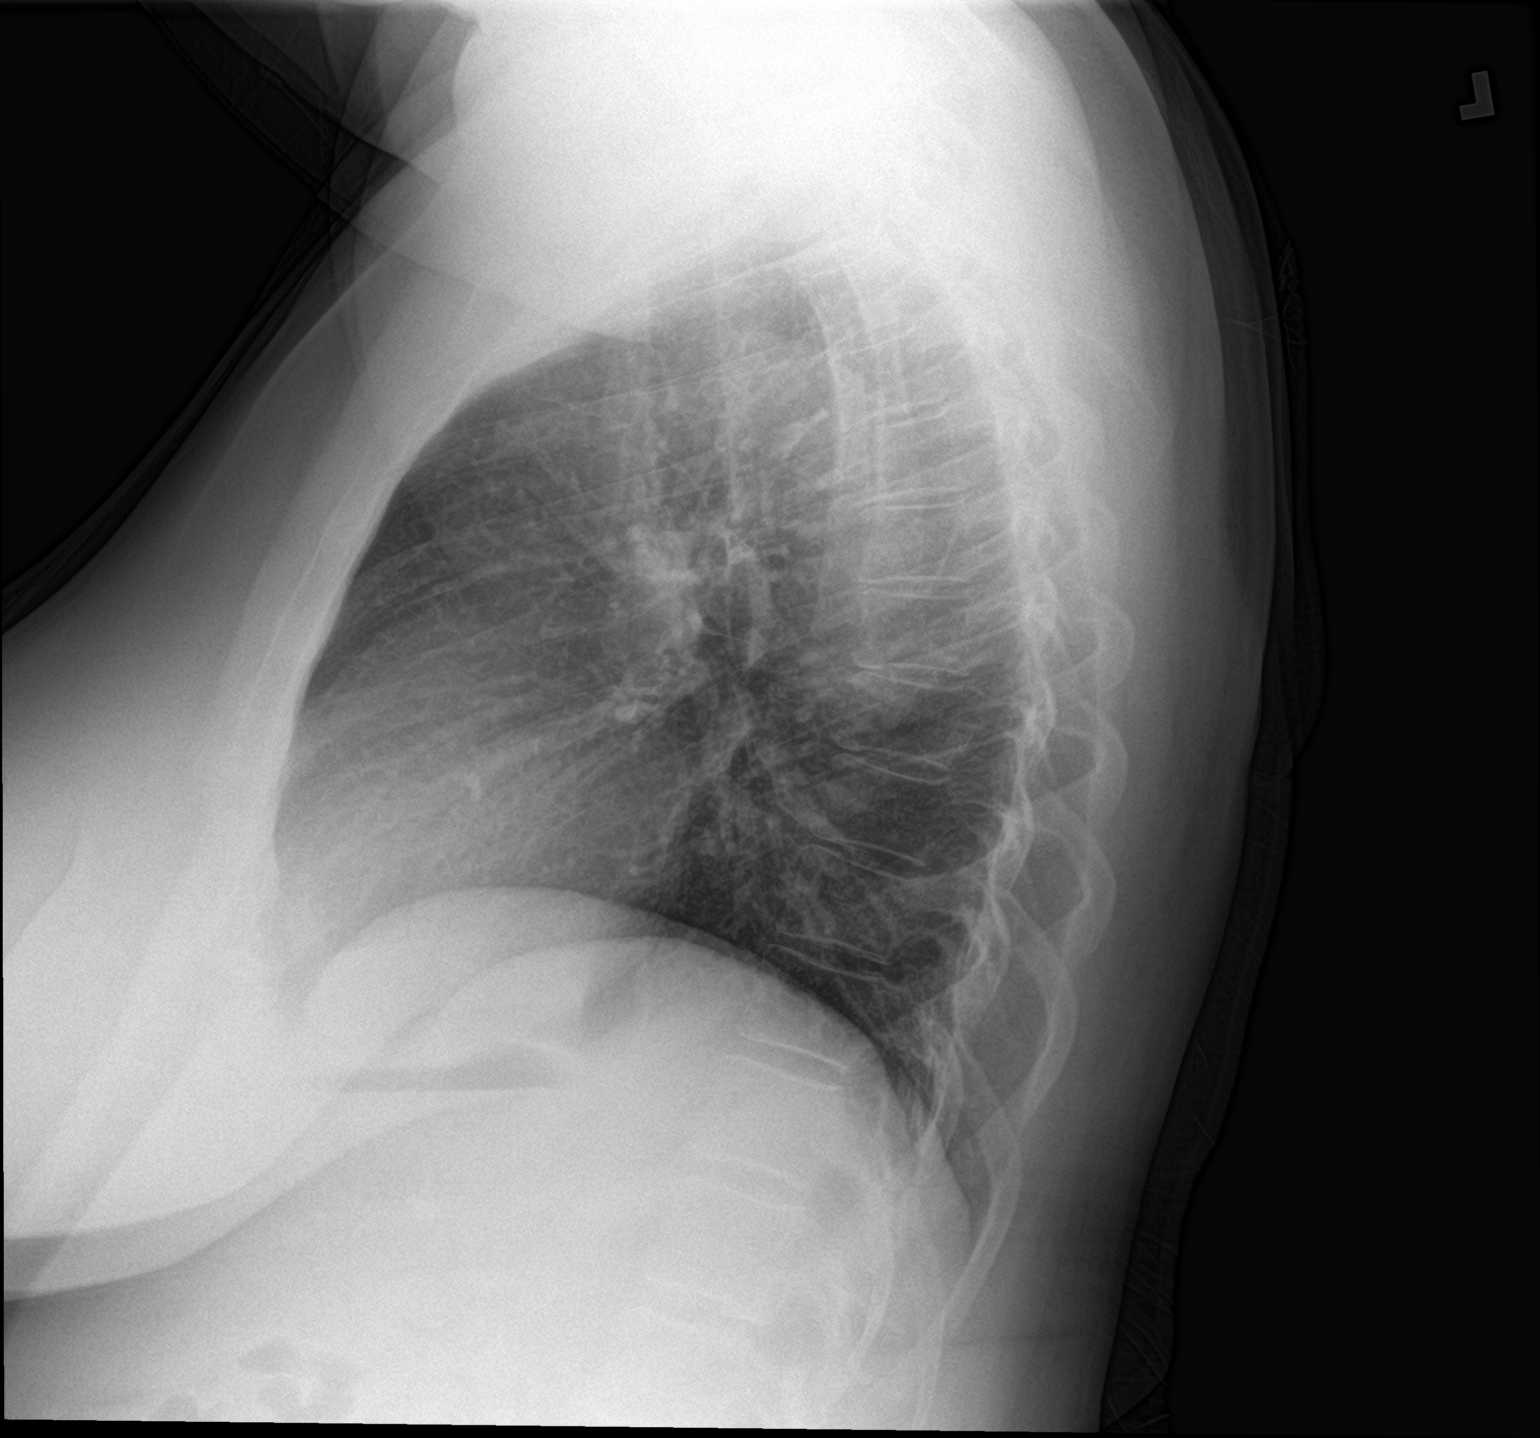

[2 of 2 positions shown; findings below may reference images not displayed]

FINDINGS: The heart size and mediastinal contours are within normal limits.
Both lungs are clear. The visualized skeletal structures are
unremarkable.
IMPRESSION: No active cardiopulmonary disease.
